# Patient Record
Sex: Male | Born: 1990 | Race: White | Hispanic: No | Marital: Single | Smoking: Never smoker
Health system: Southern US, Community
[De-identification: ages and names within clinical notes are randomized; demographics above are authoritative.]

## PROBLEM LIST (undated history)

## (undated) HISTORY — PX: WISDOM TOOTH EXTRACTION: SHX21

---
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
## 2012-10-09 ENCOUNTER — Encounter: Payer: Self-pay | Admitting: *Deleted

## 2012-10-09 ENCOUNTER — Emergency Department
Admission: EM | Admit: 2012-10-09 | Discharge: 2012-10-09 | Disposition: A | Discharge status: Home / Self Care | Payer: Managed Care, Other (non HMO) | Source: Home / Self Care | Attending: Family Medicine | Admitting: Family Medicine

## 2012-10-09 DIAGNOSIS — R21 Rash and other nonspecific skin eruption: Secondary | ICD-10-CM

## 2012-10-09 LAB — POCT CBC W AUTO DIFF (K'VILLE URGENT CARE)

## 2012-10-09 LAB — POCT MONO SCREEN (KUC): Mono, POC: NEGATIVE

## 2012-10-09 MED ORDER — HYDROXYZINE HCL 25 MG PO TABS: 25.0000 mg | ORAL_TABLET | Freq: Three times a day (TID) | ORAL | Status: DC | PRN

## 2012-10-09 NOTE — ED Provider Notes (Signed)
History     CSN: [id number]  Arrival date & time 10/09/12  1629   First MD Initiated Contact with Patient 10/09/12 1649      Chief Complaint  Patient presents with  . Rash  . Allergic Reaction    to Amox   HPI HPI  This patient complains of a RASH  Location: diffuse upper extremity   Onset: 1 day   Course: Pt went to bed with generalized itchiness. Pt woke up this am generalized rash in upper extremities. Pt is noted to have been treated with amox for strep throat 1 week ago. Has been compliant with medication. No fevers or chills. Has had some generalized fatigue as well as trouble urinating.  Self-treated with: nothing  Improvement with treatment: no.   History  Itching: yes  Tenderness: no  New medications/antibiotics: yes, amox  Pet exposure: no  Recent travel or tropical exposure: no  New soaps, shampoos, detergent, clothing: no  Tick/insect exposure: no  Chemical Exposure: no  Red Flags  Feeling ill: mild  Fever: no  Facial/tongue swelling/difficulty breathing: no  Diabetic or immunocompromised: no    History reviewed. No pertinent past medical history.  Past Surgical History  Procedure Date  . Wisdom tooth extraction     Family History  Problem Relation Age of Onset  . Cancer Mother     thyroid and breast CA  . Bipolar disorder Mother   . Depression Brother     History  Substance Use Topics  . Smoking status: Never Smoker   . Smokeless tobacco: Never Used  . Alcohol Use: No      Review of Systems  All other systems reviewed and are negative.    Allergies  Biaxin and Amoxicillin  Home Medications  No current outpatient prescriptions on file.  BP 119/76  Pulse 99  Temp 99.1 F (37.3 C) (Oral)  Resp 14  Ht 5\' 10"  (1.778 m)  Wt 163 lb (73.936 kg)  BMI 23.39 kg/m2  SpO2 98%  Physical Exam  Constitutional: He appears well-developed and well-nourished.  HENT:  Head: Normocephalic and atraumatic.  Right Ear: External ear normal.    Left Ear: External ear normal.       + oral lesions    Eyes: Conjunctivae normal are normal. Pupils are equal, round, and reactive to light.  Neck: Normal range of motion. Neck supple.  Cardiovascular: Normal rate, regular rhythm and normal heart sounds.   Pulmonary/Chest: Effort normal and breath sounds normal.  Abdominal: Soft.       No hepatosplenomegaly   Musculoskeletal: Normal range of motion.  Lymphadenopathy:    He has no cervical adenopathy.  Neurological: He is alert.  Skin: Rash noted.         ED Course  Procedures (including critical care time)  Labs Reviewed - No data to display No results found.   1. Rash       MDM  Differential diagnosis for this remains relatively broad including drug reaction secondary to amox, scarletiniform rash, secondary mono rash related to amox exposure.  Will d/c amox.  Atarax for symptomatic relief.  Will check baseline blood work including CBC, monospot, CMET, and EBV titers to better assess.  Discussed general red flags including worsening rash, decreased UOP/hematuria, trouble swallowing, trouble breathing and fever.  Plan for follow up in 1-2 days.  Discussed general care.       The patient and/or caregiver has been counseled thoroughly with regard to treatment plan and/or medications prescribed  including dosage, schedule, interactions, rationale for use, and possible side effects and they verbalize understanding. Diagnoses and expected course of recovery discussed and will return if not improved as expected or if the condition worsens. Patient and/or caregiver verbalized understanding.             Doree Albee, MD 10/09/12 1730

## 2012-10-09 NOTE — ED Notes (Signed)
Patient started Amox Rx 1 week ago for a 10 day course. His last dose was this Am. This AM he developed a rash and itching "all over". He has facial and hand edema. C/o generalized fatigue/weakness. Denies SOB, CP or dizziness. Taken one dose of Benadryl this am without relief. Dr y cough started Friday.

## 2012-10-10 ENCOUNTER — Telehealth: Payer: Self-pay | Admitting: *Deleted

## 2012-10-10 LAB — COMPREHENSIVE METABOLIC PANEL
AST: 18 U/L (ref 0–37)
Albumin: 4.8 g/dL (ref 3.5–5.2)
Alkaline Phosphatase: 47 U/L (ref 39–117)
BUN: 11 mg/dL (ref 6–23)
Glucose, Bld: 68 mg/dL — ABNORMAL LOW (ref 70–99)
Potassium: 4.2 mEq/L (ref 3.5–5.3)
Total Bilirubin: 0.7 mg/dL (ref 0.3–1.2)

## 2012-10-10 LAB — EPSTEIN-BARR VIRUS VCA, IGG: EBV VCA IgG: <10 U/mL (ref <18.0)

## 2012-10-12 ENCOUNTER — Telehealth: Payer: Self-pay | Admitting: Emergency Medicine

## 2013-06-01 ENCOUNTER — Emergency Department (INDEPENDENT_AMBULATORY_CARE_PROVIDER_SITE_OTHER)
Admission: EM | Admit: 2013-06-01 | Discharge: 2013-06-01 | Disposition: A | Discharge status: Home / Self Care | Payer: Managed Care, Other (non HMO) | Source: Home / Self Care | Attending: Family Medicine | Admitting: Family Medicine

## 2013-06-01 ENCOUNTER — Encounter: Payer: Self-pay | Admitting: Emergency Medicine

## 2013-06-01 DIAGNOSIS — R197 Diarrhea, unspecified: Secondary | ICD-10-CM

## 2013-06-01 DIAGNOSIS — H109 Unspecified conjunctivitis: Secondary | ICD-10-CM

## 2013-06-01 LAB — POCT CBC W AUTO DIFF (K'VILLE URGENT CARE)

## 2013-06-01 NOTE — ED Provider Notes (Signed)
CSN: [id number]     Arrival date & time 06/01/13  1412 History     First MD Initiated Contact with Patient 06/01/13 1424     Chief Complaint  Patient presents with  . Conjunctivitis  . Diarrhea      HPI Comments: Patient reports that about two weeks ago he developed watery diarrhea, now about 90% resolved.  Denies recent foreign travel, or drinking untreated water in a wilderness environment.   Two days ago he had a scratchy throat, and yesterday noticed slight left pinkeye.  He has been fatigued.  About 4 days ago he noted hoarseness.  Patient is a 22 y.o. male presenting with conjunctivitis. The history is provided by the patient.  Conjunctivitis This is a new problem. The current episode started yesterday. The problem occurs constantly. The problem has been gradually improving. Associated symptoms comments: Diarrhea and congestion. Nothing aggravates the symptoms. Nothing relieves the symptoms. He has tried nothing for the symptoms.    History reviewed. No pertinent past medical history. Past Surgical History  Procedure Laterality Date  . Wisdom tooth extraction     Family History  Problem Relation Age of Onset  . Cancer Mother     thyroid and breast CA  . Bipolar disorder Mother   . Depression Brother    History  Substance Use Topics  . Smoking status: Never Smoker   . Smokeless tobacco: Never Used  . Alcohol Use: No    Review of Systems  Constitutional: Positive for fatigue. Negative for fever, chills and appetite change.  HENT: Positive for congestion and sore throat. Negative for ear pain, facial swelling, neck stiffness and ear discharge.   Eyes: Positive for redness and itching. Negative for photophobia and discharge.  Respiratory: Negative.   Cardiovascular: Negative.   Gastrointestinal: Negative.   Genitourinary: Negative.     Allergies  Biaxin and Amoxicillin  Home Medications   Current Outpatient Rx  Name  Route  Sig  Dispense  Refill  . hydrOXYzine  (ATARAX/VISTARIL) 25 MG tablet   Oral   Take 1 tablet (25 mg total) by mouth 3 (three) times daily as needed for itching.   30 tablet   0    BP 117/68  Pulse 91  Temp(Src) 98.1 F (36.7 C) (Oral)  Resp 16  Ht 5\' 10"  (1.778 m)  Wt 155 lb (70.308 kg)  BMI 22.24 kg/m2  SpO2 100% Physical Exam Nursing notes and Vital Signs reviewed. Appearance:  Patient appears healthy, stated age, and in no acute distress Eyes:  Pupils are equal, round, and reactive to light and accomodation.  Extraocular movement is intact.  Conjunctivae are minimally injected, no discharge. Ears:  Canals normal.  Tympanic membranes normal.  Nose:  Mildly congested turbinates.  No sinus tenderness.   Mouth/Pharynx:  Normal; moist mucous membranes  Neck:  Supple.  No adenopathy Lungs:  Clear to auscultation.  Breath sounds are equal.  Heart:  Regular rate and rhythm without murmurs, rubs, or gallops.  Abdomen:  Nontender without masses or hepatosplenomegaly.  Bowel sounds are present.  No CVA or flank tenderness.  Extremities:  No edema.  No calf tenderness Skin:  No rash present.   ED Course   Procedures none  Labs Reviewed  POCT CBC W AUTO DIFF ([facility]) WBC 5.9; LY 49.3; MO 7.9; GR 42.8; Hgb 15.8; Platelets 205     1. Diarrhea.  Normal WBC reassuring.  Suspect viral syndrome.  ?enterovirus  2. Conjunctivitis unspecified  MDM    Avoid milk products until well.  To decrease diarrhea, mix one heaping tablespoon Citrucel (methylcellulose) in 8 oz water and drink one to three times daily.  When stools become more formed, may take Imodium (loperamide) once or twice daily to decrease stool frequency.  If symptoms become significantly worse during the night or over the weekend, proceed to the local emergency room.  For eye redness, try using refrigerated lubricating eye drops (such as "Refresh" tears) several times daily. Followup with Family Doctor if not improved in one week.   Lattie Haw,  MD 06/08/13 1758

## 2013-06-01 NOTE — ED Notes (Signed)
Reports onset of left eye conjunctivitis starting yesterday. Also wants to discuss 2 week hx of diarrhea; has not travelled outside [place].

## 2014-01-04 ENCOUNTER — Encounter: Payer: Self-pay | Admitting: Emergency Medicine

## 2014-01-04 ENCOUNTER — Emergency Department (INDEPENDENT_AMBULATORY_CARE_PROVIDER_SITE_OTHER)
Admission: EM | Admit: 2014-01-04 | Discharge: 2014-01-04 | Disposition: A | Discharge status: Home / Self Care | Payer: BC Managed Care – PPO | Source: Home / Self Care | Attending: Family Medicine | Admitting: Family Medicine

## 2014-01-04 DIAGNOSIS — J069 Acute upper respiratory infection, unspecified: Secondary | ICD-10-CM

## 2014-01-04 MED ORDER — BENZONATATE 200 MG PO CAPS: 200.0000 mg | ORAL_CAPSULE | Freq: Every day | ORAL | Status: DC

## 2014-01-04 MED ORDER — DOXYCYCLINE HYCLATE 100 MG PO CAPS
100.0000 mg | ORAL_CAPSULE | Freq: Two times a day (BID) | ORAL | Status: DC
Start: 2014-01-04 — End: 2016-12-30

## 2014-01-04 NOTE — ED Notes (Signed)
Bradley Turner complains of a dry cough for 4 days. He also reports fever, chills, body aches, headaches, runny nose, sneezing, congestion and pressure in face for 2 days. He did take NyQuil last night with some relief.

## 2014-01-04 NOTE — ED Provider Notes (Signed)
CSN: [id number]     Arrival date & time 01/04/14  1647 History   First MD Initiated Contact with Patient 01/04/14 1734     Chief Complaint  Patient presents with  . Cough    x 4 days  . Facial Pain    x 2 days      HPI Comments: Patient developed a non-productive cough 4 days ago.  Two days ago he developed mild sore throat, sinus congestion and sneezing.  Yesterday he developed increased fatigue, myalgias, fatigue, and low grade fever with chills.  The history is provided by the patient.    History reviewed. No pertinent past medical history. Past Surgical History  Procedure Laterality Date  . Wisdom tooth extraction     Family History  Problem Relation Age of Onset  . Cancer Mother     thyroid and breast CA  . Bipolar disorder Mother   . Depression Brother   . Diabetes Other    History  Substance Use Topics  . Smoking status: Never Smoker   . Smokeless tobacco: Never Used  . Alcohol Use: No    Review of Systems + sore throat + cough No pleuritic pain No wheezing + nasal congestion ? post-nasal drainage No sinus pain/pressure No itchy/red eyes No earache No hemoptysis No SOB + fever, + chills No nausea No vomiting No abdominal pain No diarrhea No urinary symptoms No skin rash + fatigue + myalgias + headache Used OTC meds without relief  Allergies  Biaxin and Amoxicillin  Home Medications   Current Outpatient Rx  Name  Route  Sig  Dispense  Refill  . Pseudoeph-Doxylamine-DM-APAP (NYQUIL PO)   Oral   Take by mouth.         . benzonatate (TESSALON) 200 MG capsule   Oral   Take 1 capsule (200 mg total) by mouth at bedtime. Take as needed for cough   12 capsule   0   . doxycycline (VIBRAMYCIN) 100 MG capsule   Oral   Take 1 capsule (100 mg total) by mouth 2 (two) times daily. (Rx void after 01/12/14)   14 capsule   0   . hydrOXYzine (ATARAX/VISTARIL) 25 MG tablet   Oral   Take 1 tablet (25 mg total) by mouth 3 (three) times daily as  needed for itching.   30 tablet   0    BP 145/81  Pulse 84  Temp(Src) 98 F (36.7 C) (Oral)  Ht 5\' 10"  (1.778 m)  Wt 164 lb (74.39 kg)  BMI 23.53 kg/m2  SpO2 99% Physical Exam Nursing notes and Vital Signs reviewed. Appearance:  Patient appears healthy, stated age, and in no acute distress Eyes:  Pupils are equal, round, and reactive to light and accomodation.  Extraocular movement is intact.  Conjunctivae are not inflamed  Ears:  Canals normal.  Tympanic membranes normal.  Nose:  Mildly congested turbinates.  No sinus tenderness.   Pharynx:  Normal Neck:  Supple.  Tender shotty posterior nodes are palpated bilaterally  Lungs:  Clear to auscultation.  Breath sounds are equal.  Heart:  Regular rate and rhythm without murmurs, rubs, or gallops.  Abdomen:  Nontender without masses or hepatosplenomegaly.  Bowel sounds are present.  No CVA or flank tenderness.  Extremities:  No edema.  No calf tenderness Skin:  No rash present.   ED Course  Procedures  none      MDM   1. Acute upper respiratory infections of unspecified site; suspect viral URI  There is no evidence of bacterial infection today.  Treat symptomatically for now  Prescription written for Benzonatate (Tessalon) to take at bedtime for night-time cough.  Take plain Mucinex (1200 mg guaifenesin) twice daily for cough and congestion.  May add Sudafed for sinus congestion.   Increase fluid intake, rest. May use Afrin nasal spray (or generic oxymetazoline) twice daily for about 5 days.  Also recommend using saline nasal spray several times daily and saline nasal irrigation (AYR is a common brand) Stop all antihistamines for now, and other non-prescription cough/cold preparations. May take Ibuprofen 200mg , 4 tabs every 8 hours with food for fever, headache, etc. Begin doxycycline if not improving about one week or if persistent fever develops (Given a prescription to hold, with an expiration date)  Follow-up with family  doctor if not improving about10 day    Lattie Haw, MD 01/06/14 1531

## 2014-01-04 NOTE — Discharge Instructions (Signed)
Take plain Mucinex (1200 mg guaifenesin) twice daily for cough and congestion.  May add Sudafed for sinus congestion.   Increase fluid intake, rest. May use Afrin nasal spray (or generic oxymetazoline) twice daily for about 5 days.  Also recommend using saline nasal spray several times daily and saline nasal irrigation (AYR is a common brand) Stop all antihistamines for now, and other non-prescription cough/cold preparations. May take Ibuprofen 200mg , 4 tabs every 8 hours with food for fever, headache, etc. Begin doxycycline if not improving about one week or if persistent fever develops   Follow-up with family doctor if not improving about10 days.

## 2016-12-30 ENCOUNTER — Emergency Department (INDEPENDENT_AMBULATORY_CARE_PROVIDER_SITE_OTHER)
Admission: EM | Admit: 2016-12-30 | Discharge: 2016-12-30 | Disposition: A | Discharge status: Home / Self Care | Payer: BLUE CROSS/BLUE SHIELD | Source: Home / Self Care | Attending: Family Medicine | Admitting: Family Medicine

## 2016-12-30 DIAGNOSIS — R369 Urethral discharge, unspecified: Secondary | ICD-10-CM

## 2016-12-30 DIAGNOSIS — Z202 Contact with and (suspected) exposure to infections with a predominantly sexual mode of transmission: Secondary | ICD-10-CM

## 2016-12-30 DIAGNOSIS — R300 Dysuria: Secondary | ICD-10-CM

## 2016-12-30 MED ORDER — DOXYCYCLINE HYCLATE 100 MG PO CAPS: 100.0000 mg | ORAL_CAPSULE | Freq: Two times a day (BID) | ORAL | 0 refills | Status: DC

## 2016-12-30 MED ORDER — CEFTRIAXONE SODIUM 250 MG IJ SOLR
250.0000 mg | Freq: Once | INTRAMUSCULAR | Status: AC
  Administered 2016-12-30: 250 mg via INTRAMUSCULAR

## 2016-12-30 NOTE — Discharge Instructions (Signed)
°  Refrain from sexual intercourse for 7 days. Be sure to have all partners tested and treated for STDs.  Practice safe sex by always wearing condoms.  ° °

## 2016-12-30 NOTE — ED Notes (Signed)
Call results to [phone]
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
## 2016-12-30 NOTE — ED Provider Notes (Signed)
CSN: [id number]     Arrival date & time 12/30/16  1017 History   None    Chief Complaint  Patient presents with  . Exposure to STD   (Consider location/radiation/quality/duration/timing/severity/associated sxs/prior Treatment) HPI  Bradley Turner is a 26 y.o. male presenting to UC with c/o penile discharge and burning with urination for about 1 week. Pt reports known exposure to someone who tested positive for gonorrhea. He has had unprotected intercourse.  Denies fever, chills, n/v/d. He reports having a rash with PCN and Biaxin.    History reviewed. No pertinent past medical history. Past Surgical History:  Procedure Laterality Date  . WISDOM TOOTH EXTRACTION     Family History  Problem Relation Age of Onset  . Cancer Mother     thyroid and breast CA  . Bipolar disorder Mother   . Depression Brother   . Diabetes Other    Social History  Substance Use Topics  . Smoking status: Never Smoker  . Smokeless tobacco: Never Used  . Alcohol use No    Review of Systems  Constitutional: Negative for chills and fever.  Gastrointestinal: Negative for abdominal pain, diarrhea, nausea and vomiting.  Genitourinary: Positive for discharge and dysuria. Negative for flank pain, frequency, genital sores, hematuria, penile pain, penile swelling and urgency.  Musculoskeletal: Negative for back pain and myalgias.    Allergies  Amoxicillin; Biaxin [clarithromycin]; and Penicillins  Home Medications   Prior to Admission medications   Medication Sig Start Date End Date Taking? Authorizing Provider  benzonatate (TESSALON) 200 MG capsule Take 1 capsule (200 mg total) by mouth at bedtime. Take as needed for cough 01/04/14   Lattie Haw, MD  doxycycline (VIBRAMYCIN) 100 MG capsule Take 1 capsule (100 mg total) by mouth 2 (two) times daily. One po bid x 7 days 12/30/16   Junius Finner, PA-C  hydrOXYzine (ATARAX/VISTARIL) 25 MG tablet Take 1 tablet (25 mg total) by mouth 3 (three) times daily as  needed for itching. 10/09/12   Floydene Flock, MD  Pseudoeph-Doxylamine-DM-APAP (NYQUIL PO) Take by mouth.    Historical Provider, MD   Meds Ordered and Administered this Visit   Medications  cefTRIAXone (ROCEPHIN) injection 250 mg (250 mg Intramuscular Given 12/30/16 1114)    BP 135/71 (BP Location: Left Arm)   Pulse 72   Temp 98.2 F (36.8 C) (Oral)   Ht 5\' 10"  (1.778 m)   Wt 174 lb (78.9 kg)   SpO2 100%   BMI 24.97 kg/m  No data found.   Physical Exam  Constitutional: He is oriented to person, place, and time. He appears well-developed and well-nourished. No distress.  HENT:  Head: Normocephalic and atraumatic.  Mouth/Throat: Oropharynx is clear and moist.  Eyes: EOM are normal.  Neck: Normal range of motion.  Cardiovascular: Normal rate and regular rhythm.   Pulmonary/Chest: Effort normal and breath sounds normal. No respiratory distress. He has no wheezes. He has no rales.  Abdominal: Soft. He exhibits no distension and no mass. There is no tenderness. There is no guarding and no CVA tenderness.  Musculoskeletal: Normal range of motion.  Neurological: He is alert and oriented to person, place, and time.  Skin: Skin is warm and dry. He is not diaphoretic.  Psychiatric: He has a normal mood and affect. His behavior is normal.  Nursing note and vitals reviewed.   Urgent Care Course     Procedures (including critical care time)  Labs Review Labs Reviewed  GC/CHLAMYDIA PROBE AMP  Imaging Review No results found.   MDM   1. STD exposure   2. Penile discharge   3. Dysuria    Pt c/o dysuria and penile discharge after known exposure to gonorrhea.   Tx in UC: Rocephin 250mg  IM  Rx: doxycycline 100mg  BID for 7 days (pt has rash with clarithromycin & is unsure if he has had azithromycin in the past)  F/u with PCP in 1 week as needed.    Junius Finnerrin O'Malley, PA-C 12/30/16 1131

## 2016-12-31 ENCOUNTER — Telehealth: Payer: Self-pay | Admitting: Emergency Medicine

## 2016-12-31 LAB — GC/CHLAMYDIA PROBE AMP
CT Probe RNA: DETECTED — AB
GC Probe RNA: DETECTED — AB

## 2017-01-02 NOTE — Telephone Encounter (Signed)
LMOM to return call to office.

## 2017-01-03 NOTE — Telephone Encounter (Signed)
Patient returned call to office. Password received, results given and discussed. Encouraged to refrain from sexual activity for at least 1 week after finishing antibiotics. Notify all partners. Sanchez verbalized understanding.

## 2017-12-03 DIAGNOSIS — R05 Cough: Secondary | ICD-10-CM | POA: Diagnosis not present

## 2017-12-03 DIAGNOSIS — J069 Acute upper respiratory infection, unspecified: Secondary | ICD-10-CM | POA: Diagnosis not present

## 2017-12-12 ENCOUNTER — Other Ambulatory Visit: Payer: Self-pay | Admitting: Family Medicine

## 2017-12-12 ENCOUNTER — Ambulatory Visit
Admission: RE | Admit: 2017-12-12 | Discharge: 2017-12-12 | Disposition: A | Discharge status: Home / Self Care | Payer: BLUE CROSS/BLUE SHIELD | Source: Ambulatory Visit | Attending: Family Medicine | Admitting: Family Medicine

## 2017-12-12 DIAGNOSIS — S299XXD Unspecified injury of thorax, subsequent encounter: Secondary | ICD-10-CM

## 2017-12-12 DIAGNOSIS — S20351D Superficial foreign body of right front wall of thorax, subsequent encounter: Secondary | ICD-10-CM | POA: Diagnosis not present

## 2017-12-12 DIAGNOSIS — R079 Chest pain, unspecified: Secondary | ICD-10-CM | POA: Diagnosis not present

## 2017-12-12 DIAGNOSIS — Z8659 Personal history of other mental and behavioral disorders: Secondary | ICD-10-CM | POA: Diagnosis not present

## 2019-06-10 ENCOUNTER — Other Ambulatory Visit: Payer: Self-pay | Admitting: *Deleted

## 2019-06-10 ENCOUNTER — Telehealth: Payer: Self-pay | Admitting: *Deleted

## 2019-06-10 DIAGNOSIS — R002 Palpitations: Secondary | ICD-10-CM

## 2019-06-10 NOTE — Telephone Encounter (Signed)
3 day ZIO XT long term holter monitor to be mailed to patients home.  Instructions reviewed briefly as the are included in the monitor kit.

## 2019-06-13 ENCOUNTER — Ambulatory Visit (INDEPENDENT_AMBULATORY_CARE_PROVIDER_SITE_OTHER): Payer: 59

## 2019-06-13 DIAGNOSIS — R002 Palpitations: Secondary | ICD-10-CM
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
## 2020-11-05 ENCOUNTER — Emergency Department
Admission: EM | Admit: 2020-11-05 | Discharge: 2020-11-05 | Disposition: A | Discharge status: Home / Self Care | Payer: BLUE CROSS/BLUE SHIELD | Source: Home / Self Care

## 2020-11-05 ENCOUNTER — Other Ambulatory Visit: Payer: Self-pay

## 2020-11-05 DIAGNOSIS — R369 Urethral discharge, unspecified: Secondary | ICD-10-CM

## 2020-11-05 MED ORDER — CEFTRIAXONE SODIUM 500 MG IJ SOLR
500.0000 mg | Freq: Once | INTRAMUSCULAR | Status: AC
  Administered 2020-11-05: 500 mg via INTRAMUSCULAR

## 2020-11-05 MED ORDER — DOXYCYCLINE HYCLATE 100 MG PO CAPS: 100.0000 mg | ORAL_CAPSULE | Freq: Two times a day (BID) | ORAL | 0 refills | Status: DC
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
## 2020-11-05 NOTE — ED Notes (Signed)
Pt waited over 15 minutes after Rocephin injection for observation. He states he feels fine, no s/s of distress.

## 2020-11-05 NOTE — ED Provider Notes (Signed)
[facility]    CSN: [id number] Arrival date & time: 11/05/20  1344      History   Chief Complaint Chief Complaint  Patient presents with  . Penile Discharge    HPI Bradley Turner is a 29 y.o. male.   HPI Bradley Turner is a 29 y.o. male presenting to UC with c/o penile discharge that started 3 days ago, associated itching and "a little" burning with urination.  Hx of unprotected sex about 1 week ago with a new partner.  Denies fever, chills, n/v/d.  Has had gonorrhea and chlamydia in the past, has done well with rocephin IM and doxycycline PO.    History reviewed. No pertinent past medical history.  There are no problems to display for this patient.   Past Surgical History:  Procedure Laterality Date  . WISDOM TOOTH EXTRACTION         Home Medications    Prior to Admission medications   Medication Sig Start Date End Date Taking? Authorizing Provider  benzonatate (TESSALON) 200 MG capsule Take 1 capsule (200 mg total) by mouth at bedtime. Take as needed for cough 01/04/14   Lattie Haw, MD  doxycycline (VIBRAMYCIN) 100 MG capsule Take 1 capsule (100 mg total) by mouth 2 (two) times daily. One po bid x 7 days 11/05/20   Lurene Shadow, PA-C  hydrOXYzine (ATARAX/VISTARIL) 25 MG tablet Take 1 tablet (25 mg total) by mouth 3 (three) times daily as needed for itching. 10/09/12   Floydene Flock, MD  Pseudoeph-Doxylamine-DM-APAP (NYQUIL PO) Take by mouth.    [provider]    Family History Family History  Problem Relation Age of Onset  . Cancer Mother        thyroid and breast CA  . Bipolar disorder Mother   . Depression Brother   . Diabetes Other   . Healthy Father     Social History Social History   Tobacco Use  . Smoking status: Never Smoker  . Smokeless tobacco: Never Used  Vaping Use  . Vaping Use: Never used  Substance Use Topics  . Alcohol use: No  . Drug use: No     Allergies   Amoxicillin, Biaxin [clarithromycin], and  Penicillins   Review of Systems Review of Systems  Constitutional: Negative for chills and fever.  Gastrointestinal: Negative for abdominal pain.  Genitourinary: Positive for dysuria and penile discharge. Negative for frequency, genital sores, hematuria, scrotal swelling, testicular pain and urgency.  Musculoskeletal: Negative for back pain.  Skin: Negative for rash.     Physical Exam Triage Vital Signs ED Triage Vitals  Enc Vitals Group     BP 11/05/20 1526 (!) 149/84     Pulse Rate 11/05/20 1526 66     Resp 11/05/20 1526 16     Temp 11/05/20 1526 98.5 F (36.9 C)     Temp Source 11/05/20 1526 Oral     SpO2 11/05/20 1526 99 %     Weight 11/05/20 1523 170 lb (77.1 kg)     Height 11/05/20 1523 5\' 10"  (1.778 m)     Head Circumference --      Peak Flow --      Pain Score 11/05/20 1523 0     Pain Loc --      Pain Edu? --      Excl. in GC? --    No data found.  Updated Vital Signs BP (!) 149/84 (BP Location: Right Arm)   Pulse 66  Temp 98.5 F (36.9 C) (Oral)   Resp 16   Ht 5\' 10"  (1.778 m)   Wt 170 lb (77.1 kg)   SpO2 99%   BMI 24.39 kg/m   Visual Acuity Right Eye Distance:   Left Eye Distance:   Bilateral Distance:    Right Eye Near:   Left Eye Near:    Bilateral Near:     Physical Exam Vitals and nursing note reviewed.  Constitutional:      Appearance: Normal appearance. He is well-developed and well-nourished.  HENT:     Head: Normocephalic and atraumatic.  Eyes:     Extraocular Movements: EOM normal.  Cardiovascular:     Rate and Rhythm: Normal rate and regular rhythm.  Pulmonary:     Effort: Pulmonary effort is normal. No respiratory distress.     Breath sounds: Normal breath sounds.  Abdominal:     General: There is no distension.     Palpations: Abdomen is soft.     Tenderness: There is no abdominal tenderness. There is no right CVA tenderness or left CVA tenderness.  Genitourinary:    Comments: Deferred, denies rash or  lesions Musculoskeletal:        General: Normal range of motion.     Cervical back: Normal range of motion.  Skin:    General: Skin is warm and dry.  Neurological:     Mental Status: He is alert and oriented to person, place, and time.  Psychiatric:        Mood and Affect: Mood and affect normal.        Behavior: Behavior normal.      UC Treatments / Results  Labs (all labs ordered are listed, but only abnormal results are displayed) Labs Reviewed - No data to display  EKG   Radiology No results found.  Procedures Procedures (including critical care time)  Medications Ordered in UC Medications  cefTRIAXone (ROCEPHIN) injection 500 mg (has no administration in time range)    Initial Impression / Assessment and Plan / UC Course  I have reviewed the triage vital signs and the nursing notes.  Pertinent labs & imaging results that were available during my care of the patient were reviewed by me and considered in my medical decision making (see chart for details).    Urine sent to lab for GC/chlamydia testing Pt declined testing for HIV or syphilis  Pt agreeable to be treated empirically Rocephin 500mg  IM given in UC Rx: doxycycline F/u with PCP as needed  Final Clinical Impressions(s) / UC Diagnoses   Final diagnoses:  Penile discharge     Discharge Instructions      Refrain from sexual intercourse for 7 days. Be sure to have all partners tested and treated for STDs.  Practice safe sex by always wearing condoms.      ED Prescriptions    Medication Sig Dispense Auth. Provider   doxycycline (VIBRAMYCIN) 100 MG capsule Take 1 capsule (100 mg total) by mouth 2 (two) times daily. One po bid x 7 days 14 capsule ,     PDMP not reviewed this encounter.   Lurene Shadow, [place] 11/05/20 1601

## 2020-11-05 NOTE — Discharge Instructions (Signed)
°  Refrain from sexual intercourse for 7 days. Be sure to have all partners tested and treated for STDs.  Practice safe sex by always wearing condoms.  ° °

## 2020-11-06 LAB — C. TRACHOMATIS/N. GONORRHOEAE RNA
C. trachomatis RNA, TMA: NOT DETECTED
N. gonorrhoeae RNA, TMA: DETECTED — AB

## 2021-04-15 DIAGNOSIS — R41840 Attention and concentration deficit: Secondary | ICD-10-CM | POA: Diagnosis not present

## 2021-04-15 DIAGNOSIS — Z7689 Persons encountering health services in other specified circumstances: Secondary | ICD-10-CM | POA: Diagnosis not present

## 2021-04-15 DIAGNOSIS — Z8659 Personal history of other mental and behavioral disorders: Secondary | ICD-10-CM | POA: Diagnosis not present

## 2021-04-22 DIAGNOSIS — F900 Attention-deficit hyperactivity disorder, predominantly inattentive type: Secondary | ICD-10-CM | POA: Insufficient documentation

## 2022-09-19 ENCOUNTER — Encounter: Payer: Self-pay | Admitting: Family Medicine

## 2022-09-19 ENCOUNTER — Ambulatory Visit: Payer: BC Managed Care – PPO | Attending: Family Medicine

## 2022-09-19 ENCOUNTER — Ambulatory Visit (INDEPENDENT_AMBULATORY_CARE_PROVIDER_SITE_OTHER): Payer: BC Managed Care – PPO | Admitting: Family Medicine

## 2022-09-19 VITALS — BP 139/81 | HR 77 | Ht 70.0 in | Wt 191.0 lb

## 2022-09-19 DIAGNOSIS — R41840 Attention and concentration deficit: Secondary | ICD-10-CM

## 2022-09-19 DIAGNOSIS — L700 Acne vulgaris: Secondary | ICD-10-CM | POA: Diagnosis not present

## 2022-09-19 DIAGNOSIS — L739 Follicular disorder, unspecified: Secondary | ICD-10-CM | POA: Insufficient documentation

## 2022-09-19 DIAGNOSIS — R002 Palpitations: Secondary | ICD-10-CM

## 2022-09-19 MED ORDER — DOXYCYCLINE HYCLATE 100 MG PO TABS: 100.0000 mg | ORAL_TABLET | Freq: Two times a day (BID) | ORAL | 0 refills | Status: AC

## 2022-09-19 MED ORDER — CLINDAMYCIN PHOS-BENZOYL PEROX 1-5 % EX GEL: Freq: Two times a day (BID) | CUTANEOUS | 0 refills | Status: DC

## 2022-09-19 NOTE — Assessment & Plan Note (Signed)
-   have sent referral to psychiatry for formal ADHD testing. Discussed that we don't want to mask adhd diagnosis with anxiety and vice versus - once he is formally tested we can create a better game plan to help [place] decide if this is adhd or anxiety

## 2022-09-19 NOTE — Assessment & Plan Note (Signed)
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 He is using proper razor techniques of clean razors and shaving cream however he is still having pustules and erythematous papules - will go ahead and treat with doxycycline and then provided pt with benzoyl peroxide-clindamycin to help clear up acne around neck.  - also advised pt to look into foil shavers and rotary razors for cleaner shaves.

## 2022-09-19 NOTE — Progress Notes (Signed)
New Patient Office Visit  Subjective    Patient ID: Bradley Turner, male    DOB: 06-12-1991  Age: 31 y.o. MRN: [id number]  CC:  Chief Complaint  Patient presents with   Establish Care    HPI Bradley Turner presents to establish care.  Pt has concerns of palpitations, ADHD concerns, and breakout on the back of his head.   He was diagnosed with ADD at 7/31yo then stopped treatment at 31yo. He has a hx of anxiety associated with panic attacks.   He is also having some heart palpitations that he is unsure if they are related to anxiety.   Mom had CAD with palpitations starting in 20s.   Outpatient Encounter Medications as of 09/19/2022  Medication Sig   clindamycin-benzoyl peroxide (BENZACLIN) gel Apply topically 2 (two) times daily.   doxycycline (VIBRA-TABS) 100 MG tablet Take 1 tablet (100 mg total) by mouth 2 (two) times daily for 7 days.   [DISCONTINUED] benzonatate (TESSALON) 200 MG capsule Take 1 capsule (200 mg total) by mouth at bedtime. Take as needed for cough   [DISCONTINUED] doxycycline (VIBRAMYCIN) 100 MG capsule Take 1 capsule (100 mg total) by mouth 2 (two) times daily. One po bid x 7 days   [DISCONTINUED] hydrOXYzine (ATARAX/VISTARIL) 25 MG tablet Take 1 tablet (25 mg total) by mouth 3 (three) times daily as needed for itching.   [DISCONTINUED] Pseudoeph-Doxylamine-DM-APAP (NYQUIL PO) Take by mouth.   No facility-administered encounter medications on file as of 09/19/2022.    No past medical history on file.  Past Surgical History:  Procedure Laterality Date   WISDOM TOOTH EXTRACTION      Family History  Problem Relation Age of Onset   Cancer Mother        thyroid and breast CA   Bipolar disorder Mother    Depression Brother    Diabetes Other    Healthy Father     Social History   Socioeconomic History   Marital status: Single    Spouse name: Not on file   Number of children: Not on file   Years of education: Not on file   Highest education level:  Not on file  Occupational History   Not on file  Tobacco Use   Smoking status: Never   Smokeless tobacco: Never  Vaping Use   Vaping Use: Never used  Substance and Sexual Activity   Alcohol use: No   Drug use: No   Sexual activity: Not on file  Other Topics Concern   Not on file  Social History Narrative   Not on file   Social Determinants of Health   Financial Resource Strain: Not on file  Food Insecurity: Not on file  Transportation Needs: Not on file  Physical Activity: Not on file  Stress: Not on file  Social Connections: Not on file  Intimate Partner Violence: Not on file    Review of Systems  Constitutional:  Negative for chills and fever.  Respiratory:  Negative for cough and shortness of breath.   Cardiovascular:  Negative for chest pain.  Neurological:  Negative for headaches.        Objective    BP 139/81   Pulse 77   Ht 5\' 10"  (1.778 m)   Wt 191 lb (86.6 kg)   SpO2 100%   BMI 27.41 kg/m   Physical Exam Vitals and nursing note reviewed.  Constitutional:      General: He is not in acute distress.    Appearance: Normal  appearance.  HENT:     Head: Normocephalic and atraumatic.     Right Ear: External ear normal.     Left Ear: External ear normal.     Nose: Nose normal.  Eyes:     Conjunctiva/sclera: Conjunctivae normal.  Cardiovascular:     Rate and Rhythm: Normal rate and regular rhythm.  Pulmonary:     Effort: Pulmonary effort is normal.     Breath sounds: Normal breath sounds.  Skin:    Comments: Back of head has scattered papules and pustules with fluctuance and yellow discharge. Erythema of lesions.  Neurological:     General: No focal deficit present.     Mental Status: He is alert and oriented to person, place, and time.  Psychiatric:        Mood and Affect: Mood normal.        Behavior: Behavior normal.        Thought Content: Thought content normal.        Judgment: Judgment normal.        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 He is using proper razor techniques of clean razors and shaving cream however he is still having pustules and erythematous papules - will go ahead and treat with doxycycline and then provided pt with benzoyl peroxide-clindamycin to help clear up acne around neck.  - also advised pt to look into foil shavers and rotary razors for cleaner shaves.      Relevant Medications   doxycycline (VIBRA-TABS) 100 MG tablet   Acne vulgaris   Relevant Medications   doxycycline (VIBRA-TABS) 100 MG tablet   clindamycin-benzoyl peroxide (BENZACLIN) gel     Other   Palpitations    - Based on patient history I believe this is more related to anxiety since he tells me that the palpitations come and go however, I do not want to rule it out as noncardiac mother does have a strong history of CAD and he tells me she had palpitations in her early 17s.  I feel at this time we should get a Zio patch monitor for 7 days to properly evaluate palpitations and see if there is any correlation with any arrhythmias      Relevant Orders   LONG TERM MONITOR (3-14 DAYS)   Concentration deficit    - have sent referral to psychiatry for formal ADHD testing. Discussed that we don't want to mask adhd diagnosis with anxiety and vice versus - once he is formally tested we can create a better game plan to help [place] decide if this is adhd or anxiety       Relevant Orders   Ambulatory referral to Psychology    Return if symptoms worsen or fail to improve.   Charlton Amor, DO

## 2022-09-19 NOTE — Progress Notes (Unsigned)
Enrolled for [employer] to mail a ZIO XT long term holter monitor to the patients address on file.   DOD to read. 

## 2022-09-19 NOTE — Patient Instructions (Signed)
Look up foil shavers and rotary shavers

## 2022-09-19 NOTE — Assessment & Plan Note (Signed)
-   Based on patient history I believe this is more related to anxiety since he tells me that the palpitations come and go however, I do not want to rule it out as noncardiac mother does have a strong history of CAD and he tells me she had palpitations in her early 48s.  I feel at this time we should get a Zio patch monitor for 7 days to properly evaluate palpitations and see if there is any correlation with any arrhythmias

## 2022-09-26 ENCOUNTER — Encounter: Payer: Self-pay | Admitting: Family Medicine

## 2022-09-26 DIAGNOSIS — R002 Palpitations: Secondary | ICD-10-CM

## 2022-11-02 DIAGNOSIS — R002 Palpitations: Secondary | ICD-10-CM | POA: Diagnosis not present

## 2023-01-12 ENCOUNTER — Ambulatory Visit: Payer: BC Managed Care – PPO | Admitting: Psychology

## 2023-01-12 DIAGNOSIS — F89 Unspecified disorder of psychological development: Secondary | ICD-10-CM | POA: Diagnosis not present

## 2023-01-12 NOTE — Progress Notes (Addendum)
Date: 01/12/2023 Appointment Start Time: 5:03pm Duration: 110 minutes Provider: Clarice Pole, PsyD Type of Session: Initial Appointment for Evaluation  Location of Patient: Home Location of Provider: Provider's Home (private office) Type of Contact: WebEx video visit with audio  Session Content:  Prior to proceeding with today's appointment, two pieces of identifying information were obtained from Bradley Turner to verify identity. In addition, Bradley Turner's physical location at the time of this appointment was obtained. In the event of technical difficulties, Bradley Turner shared a phone number he could be reached at. [name] and this provider participated in today's telepsychological service. [name] denied anyone else being present in the room or on the virtual appointment.  The provider's role was explained to Bradley Turner. The provider reviewed and discussed issues of confidentiality, privacy, and limits therein (e.g., reporting obligations). In addition to verbal informed consent, written informed consent for psychological services was obtained from Bradley Turner prior to the initial appointment. Written consent included information concerning the practice, financial arrangements, and confidentiality and patients' rights. Since the clinic is not a 24/7 crisis center, mental health emergency resources were shared, and the provider explained e-mail, voicemail, and/or other messaging systems should be utilized only for non-emergency reasons. This provider also explained that information obtained during appointments will be placed in their electronic medical record in a confidential manner. Addis verbally acknowledged understanding of the aforementioned and agreed to use mental health emergency resources discussed if needed. Moreover, Bradley Turner agreed information may be shared with other [facility] or their referring provider(s) as needed for coordination of care. By signing the new patient documents, Bradley Turner provided written consent for coordination of care.  Bradley Turner verbally acknowledged understanding he is ultimately responsible for understanding his insurance benefits as it relates to reimbursement of telepsychological and in-person services. This provider also reviewed confidentiality, as it relates to telepsychological services, as well as the rationale for telepsychological services. This provider further explained that video should not be captured, photos should not be taken, nor should testing stimuli be copied or recorded as it would be a copyright violation. Sargent expressed understanding of the aforementioned, and verbally consented to proceed.  [name] completed the psychiatric diagnostic evaluation (history, including past, family, social, and  psychiatric history; behavioral observations; and establishment of a provisional diagnosis). The evaluation was completed in 110 minutes. Code [id number] was billed.   Mental Status Examination:  Appearance:  neat Behavior: appropriate to circumstances Mood: neutral Affect: mood congruent Speech: pressured Eye Contact: appropriate Psychomotor Activity: restless Thought Process: linear, logical, and goal directed and denies suicidal, homicidal, and self-harm ideation, plan and intent Content/Perceptual Disturbances: none Orientation: AAOx4 Cognition/Sensorium: intact Insight: good Judgment: good  Provisional DSM-5 diagnosis(es):  F89 Unspecified Disorder of Psychological Development   Plan: Testing is expected to answer the question, does the individual meet criteria for ADHD when age, other mental health concerns (e.g., anxiety and depression), and cognitive functioning are taken into consideration? Further testing is warranted because a diagnosis cannot be given solely based on current interview data (further data is required). Testing results are expected to answer the remaining diagnostic questions in order to provide an accurate diagnosis and assist in treatment planning with an expectation of improved  clinical outcome. Bradley Turner is currently scheduled for an appointment on 01/18/2023 at 4pm via WebEx video visit with audio.       CONFIDENTIAL PSYCHOLOGICAL EVALUATION ______________________________________________________________________________  Name: Bradley Turner   Date of Birth: 09-08-1991    Age: 32  SOURCE AND REASON FOR REFERRAL: Bradley Turner was referred by  Bradley Turner for an evaluation to ascertain if he meets criteria for Attention Deficit/Hyperactivity Disorder (ADHD).    BACKGROUND INFORMATION AND PRESENTING PROBLEM: Bradley Turner is a 32 year old male who resides in [place]  Bradley Turner shared he believes he was diagnosed with "ADD" by his pediatrician at age 66 or 74, adding he was prescribed an unknown ADHD-related medication and utilized it until his father ceased its use because he believed he "just needed discipline." He further shared how after ceasing use of the medicine there was a "dip" in his academic performance. He described his ADHD-related concerns as including being easily distracted by various stimuli (e.g., sounds, his phone, or use of the internet); task initiation (e.g., often procrastinating tasks that he finds "daunting," has aspects he does not fully know how to do, and/or if he is "fatigued" which las led to others making comments about it and contributes to stress), maintenance (e.g., prone to "jump[ing] around" to different tasks before completing them), and disengagement (e.g., commonly having urges to complete certain tasks despite the negative repercussions it can cause on completing higher priority tasks, which he attributed to trouble prioritizing tasks, difficulty remembering to return to the task later, or concerns he will not have the "motivation" to finish the task at a later time); regular forgetfulness of a variety of things (e.g., tasks he needs to do or has already done, misplacing items, and what others have said); visual (e.g.,  frequently not seeing items in his view that he is looking for) and verbal (e.g., trouble remembering points someone has mentioned as they continue talking) working memory-related problems; often making careless mistakes and missing small details (e.g., "not seeing" a part of the instructions he read or a mistake he had made until someone else points it out); self-expression issues (e.g., having a "mental block on words" he wants to say); experiencing irritability, annoyance, or overstimulation if someone points out a mistake he made or makes him feel "inadequate," he perceives issues with others driving abilities, multiple people are talking to him at one time, or someone interrupts him while he is focused on a task, sharing how this can lead to self-critical thoughts and sadness; impulsivity that includes often agreeing to plans that negatively impacted his ability to complete school assignments, "speak[ing] before I think," and unneeded purchases that cause difficulty saving; difficulty sustaining his attention when others are speaking to him despite efforts and a desire to do so; disorganization (e.g., his environment being commonly cluttered which contributes to misplacing items) that he attributed to "low motivation" to maintain organization systems; feel restless when required to stay seated to the point it is "almost physically painful;" habitual fidgeting (e.g., adjusting his sitting position and "shaking" his leg); frequently having urges to interrupt that he attributed to wanting to mention something before the conversation topic changes, "to point something out," and concerns he will forget what he wants to say if he does not say it then; regularly driving [id number] over the speed limit due to a desire to "get to the destination faster" and a dislike of driving; and occasional trouble following through on promises or commitments to others despite placing importance on doing so and feeling "like a jackass"  if he does not follow through. He also described a history of anxiety; periods of feeling "down" that usually last "a day or two," although he shared on one instance he "felt dead inside" for two-to-three months as a result of psychosocial stressors and a "panic  attack;" generalized anxiety and tension that can be seemingly random or "situational" (e.g., withdrawing from college and moving back in with his parents); sleep maintenance issues (e.g., waking up and having trouble falling back asleep), snoring that started in his "late teens" and appears related to periods of weight gain, and a "few" instances of sleep paralysis; overeating-related behaviors (e.g., "impulsive[ly]" "eating "past the point of full" if he enjoys the food, which can cause physical discomfort); and trouble with self-expression that he noted is in part due to "gr[owing] up gay in a conservative household." He described his ADHD-related concerns as "every day" and independent of mood, although environments with multiple stimuli, anxiety, and reduced sleep exacerbate them.   Mr. Ritchie denied awareness of ever experiencing any developmental milestone delays, grade retention, learning disability diagnosis, of having an IEP. He shared he attended a "remedial reading group" as it "took him a bit longer to start reading," which he attributed to doubts about his reading abilities and that once he realized "I can do this" he quickly became an "advanced reader." He also shared mathematics has "never [been] clear cut" and difficult for him as he frequently felt he knew the proper calculations but it was "hard to get to the right answer." He discussed while utilizing an unknown ADHD-related medication his grades were "straight As," but after ceasing they became a "C average" although he still obtained "As" and "Bs" in classes he enjoyed (e.g., history and English). He also discussed how he was "good at taking tests" but had trouble completing homework  which teachers commented on it as well as was being prone to "day dreaming" and "thinking about more interesting things" while "pretending to pay attention" during class. He stated he attended college "on and off" for several years but only completed 2-3 years' worth of classes as he withdrew from approximately half of the classes due to difficulty completing assignments and becoming "overwhelmed by what needed to be done." He reported he is currently employed at [employer], and denied a history of disciplinary action at any employment positions. 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   Mr. Arnesen described his medical history as unremarkable as well as denying having ever experiencing head injury or seizures. He reported use of 16oz of coffee daily and one-to-two standard size drinks of alcohol a week. He denied having been psychiatrically hospitalized or having utilized mental health services. He also denied ever experiencing hypomanic or manic episode; obsessions and compulsions; psychosis; trauma- and stressor-related disorder; suicidal or homicidal ideation, plan, or intent; and legal involvement. He stated his familial mental health history is significant for "depression," "anxiety," "OCD," "bipolar disorder," and possible schizophrenia. He also shared a belief his mother and father have possible ADHD as his father was better with "hands on tasks" and "rambunctious," and they both struggled "keeping interest in" and "prioritz[ing] schooling."  Chart Review: On a 09/19/2022 appointment note, Dr. Mel Almond reported he presented with "palpitations" and "ADHD concerns," and that he stated he was "diagnosed with ADD at [age]yo then stopped treatment at 32yo" and has a history of "anxiety associated with panic attacks." She further reported his mother had CAD with palpitations which led to her recommending he use  a Zio patch monitor to evaluate his endorsed palpitations. She also referred him for an ADHD evaluation to assist in "create[ing] a better game plan to help [them] decide if [his endorsed concerns  are] ADHD or anxiety."            Dolores Lory, PsyD

## 2023-01-18 ENCOUNTER — Ambulatory Visit: Payer: BC Managed Care – PPO | Admitting: Psychology

## 2023-01-18 DIAGNOSIS — F89 Unspecified disorder of psychological development: Secondary | ICD-10-CM

## 2023-01-18 NOTE — Progress Notes (Signed)
Date: 01/18/2023   Appointment Start Time: 4:03pm Duration: 65 minutes Provider: Clarice Pole, PsyD Type of Session: Testing Appointment for Evaluation  Location of Patient: Home Location of Provider: Provider's Home (private office) Type of Contact: WebEx video visit with audio  Session Content: Today's appointment was a telepsychological visit due to COVID-19. Bradley Turner is aware it is his responsibility to secure confidentiality on his end of the session. Prior to proceeding with today's appointment, Bradley Turner's physical location at the time of this appointment was obtained as well a phone number he could be reached at in the event of technical difficulties. Zeal denied anyone else being present in the room or on the virtual appointment. This provider reviewed that video should not be captured, photos should not be taken, nor should testing stimuli be copied or recorded as it would be a copyright violation. Clary expressed understanding of the aforementioned, and verbally consented to proceed. The WAIS-IV was administered, scored, and interpreted by this evaluator  Billing codes will be input on the feedback appointment. There are no billing codes for the testing appointment.   Provisional DSM-5 diagnosis(es):  F89 Unspecified Disorder of Psychological Development   Plan: [name] was scheduled for a feedback appointment on 01/25/2023 at 3pm via WebEx video visit with audio.                Dolores Lory, PsyD

## 2023-01-25 ENCOUNTER — Ambulatory Visit: Payer: BC Managed Care – PPO | Admitting: Psychology

## 2023-01-25 DIAGNOSIS — F902 Attention-deficit hyperactivity disorder, combined type: Secondary | ICD-10-CM

## 2023-01-25 NOTE — Progress Notes (Signed)
Testing and Report Writing Information: The following measures  were administered, scored, and interpreted by this provider:  Generalized Anxiety Disorder-7 (GAD-7; 5 minutes), Patient Health Questionnaire-9 (PHQ-9; 5 minutes), Wechsler Adult Intelligence Scale-Fourth Edition (WAIS-IV; 70 minutes), CNS Vital Signs (45 minutes), Adult Attention Deficit/Hyperactivity Disorder Self-Report Scale Checklist (ASRSv1.1; 15 minutes), Behavior Rating Inventory for Executive Function - A - Self Report (BRIEF A; 10 minutes) and Behavior Rating Inventory for Executive Function - A - Informant  (BRIEF-A; 10 minutes) , Personality Assessment Inventory (PAI; 50 minutes). A total of 210 minutes was spent on the administration and scoring of the aforementioned measures. Codes [id number] and [phone] (6 units) were billed.  Please see the assessment for additional details. This provider completed the written report which includes integration of patient data, interpretation of standardized test results, interpretation of clinical data, review of information provided by Bradley Turner and any collateral information/documentation, and clinical decision making (275 minutes in total).  Feedback Appointment: Date: 01/25/2023 Appointment Start Time: 3pm Duration: 40 minutes Provider: Clarice Pole, PsyD Type of Session: Feedback Appointment for Evaluation  Location of Patient: Home Location of Provider: Provider's Home (private office) Type of Contact: WebEx video visit with audio  Session Content: Today's appointment was a telepsychological visit due to COVID-19. Bradley Turner is aware it is his responsibility to secure confidentiality on his end of the session. He provided verbal consent to proceed with today's appointment. Prior to proceeding with today's appointment, Bradley Turner's physical location at the time of this appointment was obtained as well a phone number he could be reached at in the event of technical difficulties. Bradley Turner denied anyone else being  present in the room or on the virtual appointment.  This provider and [name] completed the interactive feedback session which includes reviewing the aforementioned measures, treatment recommendations, and diagnostic conclusions.   The interactive feedback session was completed today and a total of 40 minutes was spent on feedback. Code [phone] was billed for feedback session.   DSM-5 Diagnosis(es):  F90.2 Attention-Deficit/Hyperactivity Disorder, Combined Presentation, Moderate  Time Requirements: Assessment scoring and interpreting: 210 minutes (billing code [phone] and [phone] [6 units]) Feedback: 40 minutes (billing code [phone]) Report writing: 275 total minutes. 01/05/2023: 2:40-2:50pm (inputting chart review information into the report). 01/14/2023: 9:55-10:40am and 11:25-12:34m. 01/16/2023: 8:40-9:10pm. 01/18/2023: 5:35-5:45pm. 01/19/2023: 8-9:10pm. 01/21/2023: 10:10-10:50am.; 01/23/2023: 8:55-9:15pm. 01/24/2023: 1:25-1:30pm.  (billing code [phone] [5 units])  Plan: Bradley Turner provided verbal consent for his evaluation to be sent via e-mail. No further follow-up planned by this provider.          CONFIDENTIAL PSYCHOLOGICAL EVALUATION ______________________________________________________________________________  Name: Bradley Turner   Date of Birth: 08-05-1991    Age: 32 Dates of Evaluation: 01/12/2023, 01/14/2023, 01/16/2023, and 01/18/2023   SOURCE AND REASON FOR REFERRAL: [name] was referred by Dr. Elmo Turner for an evaluation to ascertain if he meets criteria for Attention Deficit/Hyperactivity Disorder (ADHD).   EVALUATIVE PROCEDURES: Clinical Interview with [name] (01/12/2023) Wechsler Adult Intelligence Scale-Fourth Edition (WAIS-IV; 01/18/2023) CNS Vital Signs (01/16/2023) Adult Attention Deficit/Hyperactivity Disorder Self-Report Scale Checklist (01/16/2023) Behavior Rating Inventory for Executive Function - A - Self Report Behavior Rating Inventory for Executive Function - A - Self  Report (BRIEF-A; 01/14/2023) and Informant (01/14/2023) Personality Assessment Inventory (PAI; 01/14/2023) Patient Health Questionnaire-9 (PHQ-9) Generalized Anxiety Disorder-7 (GAD-7)   BACKGROUND INFORMATION AND PRESENTING PROBLEM: [name] is a 32 year old male who resides in [place].  [name] shared he believes he was diagnosed with "ADD" by his pediatrician at age 57 or  9, adding he was prescribed an unknown ADHD-related medication and utilized it until his father ceased its use because he believed he "just needed discipline." He further shared after ceasing use of the medicine there was a "dip" in his academic performance. [name] described his ADHD-related concerns as including being easily distracted by various stimuli (e.g., sounds, his phone, or use of the internet); task initiation (e.g., often procrastinating tasks that he finds "daunting," has aspects he does not fully know how to do, and/or if he is "fatigued" which has led to others making comments about it and contributes to stress), maintenance (e.g., prone to "jump[ing] around" to different tasks before completing them), and disengagement (e.g., commonly having urges to complete certain tasks despite the negative repercussions it can cause on completing higher priority tasks, which he attributed to trouble prioritizing tasks, difficulty remembering to return to the task later, or concerns he will not have the "motivation" to finish the task at a later time); regular forgetfulness of a variety of things (e.g., tasks he needs to do or has already done, misplacing items, and what others have said); visual (e.g., frequently not seeing items in his view that he is looking for) and verbal (e.g., trouble remembering points someone has mentioned as they continue talking) working memory-related problems; often making careless mistakes and missing small details (e.g., "not seeing" a part of the instructions he read or a mistake he had  made until someone else points it out); self-expression issues (e.g., having a "mental block on words" he wants to say); experiencing irritability, annoyance, or overstimulation if someone points out a mistake he made or makes him feel "inadequate," he perceives issues with others driving abilities, multiple people are talking to him at one time, or someone interrupts him while he is focused on a task, sharing this can lead to self-critical thoughts and sadness; impulsivity that includes often agreeing to plans that negatively impacted his ability to complete school assignments, "speak[ing] before [he] think[s]," and unneeded purchases that cause difficulty saving; difficulty sustaining his attention when others are speaking to him despite efforts and a desire to do so; disorganization (e.g., his environment being commonly cluttered which contributes to misplacing items) that he attributed to "low motivation" to maintain organization systems; feeling restless when required to stay seated to the point it is "almost physically painful;" habitual fidgeting (e.g., adjusting his sitting position and "shaking" his leg); frequently having urges to interrupt that he attributed to wanting to mention something before the conversation topic changes, "to point something out," and concerns he will forget what he wants to say if he does not say it then; regularly driving [id number] over the speed limit due to a desire to "get to the destination faster" and a dislike of driving; and occasional trouble following through on promises or commitments to others despite placing importance on doing so and feeling "like a jackass" if he does not follow through. He also described a history of anxiety; periods of feeling "down" that usually last "a day or two," although he shared on one instance he "felt dead inside" for two-to-three months as a result of psychosocial stressors and a "panic attack;" generalized anxiety and tension that can be  seemingly random or "situational (e.g., withdrawing from college and moving back in with his parents);" sleep maintenance issues (e.g., waking up and having trouble falling back asleep), snoring that started in his "late teens" and appears related to periods of weight gain, and a "few" instances of sleep paralysis;  overeating-related behaviors (e.g., "impulsive[ly] eating past the point of full" if he enjoys the food, which can cause physical discomfort); and trouble with self-expression that he noted is in part due to "gr[owing] up gay in a conservative household." He described his ADHD-related concerns as occurring "every day" and independent of mood, although environments with multiple stimuli, anxiety, and reduced sleep exacerbate them.   Bradley Turner denied awareness of ever experiencing any developmental milestone delays, grade retention, learning disability diagnosis, or having an IEP. He shared he attended a "remedial reading group" as it "took [him] a bit longer to start reading," which he attributed to doubts about his reading abilities and that once he realized "[he could] do this" he quickly became an "advanced reader." He also shared mathematics has "never [been] clear cut" and difficult for him as he frequently felt he knew the proper calculations but it was "hard to get to the right answer." Bradley Turner discussed while utilizing an unknown ADHD-related medication his grades were "straight As," but after ceasing the medication, they became a "C average" although he still obtained "As" and "Bs" in classes he enjoyed (e.g., history and English). He also discussed he was "good at taking tests" but had trouble completing homework which teachers commented on it as well as was being prone to "day dreaming" and "thinking about more interesting things" while "pretending to pay attention" during class. He stated he attended college "on and off" for several years but only completed 2-3 years' worth of classes  as he withdrew from approximately half of the classes due to difficulty completing assignments and becoming "overwhelmed by what needed to be done." Mr. Catching reported he is currently employed at [employer], and denied a history of disciplinary action at any employment positions. 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   Mr. Stankevich described his medical history as unremarkable as well as denied a history of experiencing head injury or seizures. He reported use of 16oz of coffee daily and one-to-two standard size drinks of alcohol a week. He denied a history of psychiatric hospitalizations and utilization of mental health services. He also denied ever experiencing hypomanic or manic episode; obsessions and compulsions; psychosis; trauma- and stressor-related disorder; suicidal or homicidal ideation, plan, or intent; and legal involvement. Mr. Lizak stated his familial mental health history is significant for "depression," "anxiety," "OCD," "bipolar disorder," and possible schizophrenia. He also shared a belief his mother and father possibly meet criteria for ADHD as his father was better with "hands on tasks" and "rambunctious," and they both struggled "keeping interest in" and "prioritz[ing] schooling."  Chart Review: On a 09/19/2022 appointment note, Dr. Mel Almond reported Mr. Teaney presented with "palpitations" and "ADHD concerns," and he stated he was "diagnosed with ADD at [age]yo then stopped treatment at 32yo" and has a history of "anxiety associated with panic attacks." She further reported his mother had CAD with palpitations which led to her recommending he use a Zio patch monitor to evaluate his endorsed palpitations. She also referred him for an ADHD evaluation to assist in "create[ing] a better game plan to help [them] decide if [his endorsed concerns are] ADHD or anxiety."  BEHAVIORAL  OBSERVATIONS: Mr. Stutes presented on time for the evaluation. He was well-groomed. He was oriented to time, place, person, and purpose of the appointment. During the evaluation Mr. Barbian verbalized and/or demonstrated occasional self-expression difficulties (e.g., stating "I am not sure how  to describe [the similarity between two provided words]" or "[The provided word] is something I know what it is but not how to explain it") and auditory working memory-related difficulties (e.g., having this evaluator repeat verbally provided arithmetic questions, using his fingers to assist in tracking and manipulating verbally provided information, and stating "That is where I have to stop" after providing three of six verbally presented digits). Throughout the course of the evaluation, he maintained appropriate eye contact. His thought processes and content were logical, coherent, and goal-directed. There were no overt signs of a thought disorder or perceptual disturbances, nor did she report such symptomatology. There was no evidence of paraphasias (i.e., errors in speech, gross mispronunciations, and word substitutions), repetition deficits, or disturbances in volume or prosody (i.e., rhythm and intonation). Overall, based on Mr. Ashbeck approach to testing, the current results are believed to be a good estimate of his abilities.  PROCEDURAL CONSIDERATIONS:  Psychological testing measures were conducted through a virtual visit with video and audio capabilities, but otherwise in a standard manner.   The Wechsler Adult Intelligence Scale, Fourth Edition (WAIS-IV) was administered via remote telepractice using digital stimulus materials on [employer]'s Q-global system. The remote testing environment appeared free of distractions, adequate rapport was established with the examinee via video/audio capabilities, and Mr. Boyajian appeared appropriately engaged in the task throughout the session. During one Digit Span task Mr.  Drzewiecki indicated his cat was distracting him, which may have negatively impacted his performance on that task as he gave an erroneous answer. No other significant technological problems or distractions were noted during administration. Modifications to the standardization procedure included: none. The WAIS-IV subtests, or similar tasks, have received initial validation in several samples for remote telepractice and digital format administration, and the results are considered a valid description of Mr. Espina skills and abilities.  CLINICAL FINDINGS:  COGNITIVE FUNCTIONING  Wechsler Adult Intelligence Scale, Fourth Edition (WAIS-IV):  Mr. Lalor completed subtests of the WAIS-IV, a full-scale measure of cognitive ability. He completed subtests of the WAIS-IV, a full-scale measure of cognitive ability. The WAIS-IV is comprised of four indices that measures cognitive processes that are components of intellectual ability; however, only subtests from the Verbal Comprehension and Working Memory indices were administered. As a result, Full-Scale-IQ (FSIQ) and General Ability Index (GAI) were unable to be determined.   WAIS-IV Scale/Subtest IQ/Scaled Score 95% Confidence Interval Percentile Rank Qualitative Description  Verbal Comprehension (VCI) 114 108-119 82 High Average  Similarities 11     Vocabulary 11     Information 16     Working Memory (WMI) 102 95-109 55 Average  Digit Span 9     Arithmetic 12       The Verbal Comprehension Index (VCI) provides a measure of one's ability to receive, comprehend, and express language. It also measures the ability to retrieve previously learned information and to understand relationships between words and concepts presented orally. Mr. Schauer obtained a VCI scaled score of 114 (82nd percentile) placing him in the high average range compared to same-aged peers. His performance on the subtests comprising this index was diverse. Out of the three subtests, Mr.  Hallum demonstrated the strongest performance on the Information subtest which is primarily a measure of his fund of general knowledge but may also be influenced by cultural experience, quality of education, and ability to retrieve information from long-term memory. His lowest performances were on the Similarities subtest, which measured his ability to abstract meaningful concepts and relationships from verbally presented material  as well as the Vocabulary subtest, which required him to explain the meaning of words presented in isolation, verbalize meaningful concepts, and retrieve information from long-term memory. This pattern of performance may suggest relative weaknesses in language production and abstract reasoning.   The Working Memory Index ([place]) provides a measure of one's ability to sustain attention, concentrate, and exert mental control. Mr. Brouse obtained a WMI scaled score of 102 (55th percentile), placing him in the average range compared to same-aged peers. The 12-point difference between the VCI and WMI scores is statistically significant at the .05 level, which suggests his ability to sustain attention, concentrate, and exert mental control are a relative weakness to his verbal reasoning abilities. His score on the Arithmetic subtest is higher than his score on Digit Span which may indicate specific strengths in arithmetic computational skills rather than a general proficiency in working memory.  ATTENTION AND PROCESSING  CNS Vital Signs: The CNS Vital Signs assessment evaluates the neurocognitive status of an individual and covers a range of mental processes. The results of the CNS Vital Signs testing indicated average neurocognitive processing ability. Regarding attention, simple attention and complex attention were in the average range and sustained attention was a strength in the above range. Executive function and cognitive flexibility were in the average range. Working memory was a  strength in the above range. Psychomotor speed, motor speed, and processing speed were average which indicates average hand-eye coordination and thinking speed. Reaction time was a weakness in the low average range. Visual memory (images) and verbal memory (words) were average, although verbal memory was 13 points higher which indicates it is a relative strength. The results suggest Mr. Aung experiences a weakness in reaction time and strengths in working memory and sustained attention.   Domain  Standard Score Percentile Validity Indicator Guideline  Neurocognitive Index 94 34 Yes Average  Composite Memory 103 58 Yes Average  Verbal Memory 109 73 Yes Average  Visual Memory 96 40 Yes Average  Psychomotor Speed 96 40 Yes Average  Reaction Time 86 18 Yes Low Average  Complex Attention 94 34 Yes Average  Cognitive Flexibility 90 25 Yes Average  Processing Speed  96 40 Yes Average  Executive Function 96 40 Yes Average  Working Memory 118 88 Yes Above  Sustained Attention 116 86 Yes Above  Simple Attention 107 68 Yes Average  Motor Speed 98 45 Yes Average  EXECUTIVE FUNCTION  Behavior Rating Inventory of Executive Function, Second Edition (BRIEF-A) Self-Report: Mr. Kukuk completed the Self-Report Form of the Behavior Rating Inventory of Executive Function-Adult Version (BRIEF-A), which has three domains that evaluate cognitive, behavioral, and emotional regulation, and a Global Executive Composite score provides an overall snapshot of executive functioning. There are no missing item responses in the protocol. The Negativity, Infrequency, and Inconsistency scales are not elevated, suggesting he did not respond to the protocol in an overly negative, haphazard, extreme, or inconsistent manner. In the context of these validity considerations, ratings of Mr. Bassin everyday executive function suggest some areas of concern. The overall index, the Global Executive Composite (GEC), was elevated (GEC T  = 83, %ile = 99). Both the Behavioral Regulation (BRI) and the Metacognition (MI) Indexes were elevated (BRI T = 75, %ile = 97 and MI T = 85, %ile = >99). Mr. Amjad indicated difficultly with his ability to inhibit impulsive responses, adjust to changes in routine or task demands, modulate emotions, initiate problem solving or activity, sustain working memory, plan and organize problem-solving approaches, and  attend to task-oriented output. He did not describe his ability to monitor social behavior and organize the environment and materials as problematic. Mr. Grahn profile suggests significant problem-solving rigidity combined with emotional dysregulation. Individuals with this profile tend to lose emotional control when their routines or perspectives are challenged and/or flexibility is required. Moreover, the elevated scores on the Inhibit scale as well as the Behavioral Regulation and the Metacognition Indexes, suggest he is experiencing poor inhibitory control and/or suggest more global behavioral dysregulation is having a negative effect on active metacognitive problem solving.  Scale/Index  Raw Score T Score Percentile  Inhibit 18 71 97  Shift 14 74 98  Emotional Control 24 71 96  Self-Monitor 12 64 94  Behavioral Regulation Index (BRI) 68 75 97  Initiate 22 84 >99  Working Memory 22 88 >99  Plan/Organize 26 83 99  Task Monitor 16 83 >99  Organization of Materials 17 63 93  Metacognition Index (MI) 103 85 >99  Global Executive Composite (GEC) 171 83 99   Validity Scale Raw Score Cumulative Percentile Protocol Classification  Negativity 1 0 - 98.3 Acceptable  Infrequency 0 0 - 97.3 Acceptable  Inconsistency 3 0 - 99.2 Acceptable   Behavior Rating Inventory of Executive Function, Second Edition (BRIEF-A) Informant: Mr. Shad friend, Mr. Sharyl Nimrod, completed the Informant Form of the Behavior Rating Inventory of Executive Function-Adult Version (BRIEF-A), which is equivalent to  the Self-Report version and has three domains that evaluate cognitive, behavioral, and emotional regulation, and a Global Executive Composite score provides an overall snapshot of executive functioning. There are no missing item responses in the protocol; however, the Negativity scale is elevated, suggesting either the respondent's view of Mr. Halliday may be excessively negative, or Mr. Fristoe may be exhibiting significant executive dysfunction. Items were completed in a reasonable fashion, suggesting Mr. Moser did not respond to items in a haphazard or extreme manner. In the context of these validity considerations, Mr. Conrad Good Thunder ratings of Mr. Kohlhoff everyday executive function suggest some areas of concern. The overall index, the Global Executive Composite (GEC), was elevated (GEC T = 80, %ile = >99). Both the Behavioral Regulation (BRI) and the Metacognition (MI) Indexes were elevated (BRI T = 76, %ile = >99 and MI T = 80, %ile = >99). Mr. Ermalene Postin indicated Mr. Critcher experiences difficultly with his ability to inhibit impulsive responses, adjust to changes in routine or task demands, modulate emotions, initiate problem solving or activity, sustain working memory, plan and organize problem-solving approaches, and attend to task-oriented output. Mr. Ermalene Postin did not describe Mr. Bessler ability to monitor social behavior and organize the environment and materials as problematic. Individuals with this profile tend to lose emotional control when their routines or perspectives are challenged and/or flexibility is required. Moreover, the elevated scores on the Inhibit scale as well as the Behavioral Regulation and the Metacognition Indexes, suggest he is perceived as experiencing poor inhibitory control and/or suggest more global behavioral dysregulation is having a negative effect on active metacognitive problem solving. Upon follow-up, Mr. Dirusso indicated being unsure what may have contributed to Mr. Moser's  elevated Negativity scale, although stated he notified him to be "brutally honest" when filling it out.   Scale/Index  Raw Score T Score Percentile  Inhibit 22 84 >99  Shift 18 84 >99  Emotional Control 26 70 96  Self-Monitor 11 56 79  Behavioral Regulation Index (BRI) 77 76 >99  Initiate 23 83 >99  Working Memory 24 91 >99  Plan/Organize 29  83 >99  Task Monitor 16 79 99  Organization of Materials 14 52 68  Metacognition Index (MI) 106 80 >99  Global Executive Composite (GEC) 183 80 >99   Validity Scale Raw Score Cumulative Percentile Protocol Classification  Negativity 7 99.0 - 100 Elevated  Infrequency 0 0 - 93.3 Acceptable  Inconsistency 2 0 - 98.8 Acceptable    BEHAVIORAL FUNCTIONING   Patient Health Questionnaire-9 (PHQ-9): Bradley Turner completed the PHQ-9, a self-report measure that assesses symptoms of depression. He scored 18/27, which indicates moderately severe depression.   Generalized Anxiety Disorder-7 (GAD-7): Bradley Turner completed the GAD-7, a self-report measure that assesses symptoms of anxiety. He scored 12/21, which indicates moderate anxiety.   Adult ADHD Self-Report Scale Symptom Checklist (ASRS): Bradley Turner reported the following symptoms as sometimes: problems remembering appointments or obligations, struggling to concentrate on what people say even when they are speaking directly to him, misplacing or has difficulty finding things, talking too much in social situations, and interrupting others or finishing their sentences. He endorsed the following symptoms as occurring often: struggling to sustain attention when doing boring or repetitive work, difficulty wrapping up final details of a project following the completion of challenging aspects, difficulty getting things in order when a task requires organization, being distracted by noise around him, and feeling restless or fidgety. He endorsed the following symptoms as very often: avoiding or delaying getting  started on tasks requiring a lot of thought, fidgeting or squirming, and making careless mistakes when working on boring or difficult projects. Endorsement of at least four items in Part A is highly consistent with ADHD in adults. The frequency scores of Part B provides additional cues. Bradley Turner scored a 5/6 on Part A and 6/12 on Part B, which is considered a positive screening for ADHD.   Personality Assessment Inventory (PAI): The PAI is an objective inventory of adult personality. The validity indicators suggested [name] profile is interpretable (ICN T = 58, INF T = 59, NIM T = 51, and PIM T = 45). He is endorsing beliefs of inadequacy in dealing with demands of the environment (DEP-C T = 87) and identity issues (BOR-I T = 65) that may at least partially explain feelings of sadness and anhedonia (DEP-A T = 74 and BOR-A T = 60), tension and difficulty relaxing (ANX-A T = 65 and ANX-P T = 69), and having specific fears (ARD-P T = 70); problems in concentration and decision making (SCZ-T T = 70); impulsivity and recklessness (BOR-S T = 64); and seeking to relinquish control in relationships and preferring to approach them in a passive manner (DOM T = 33). He appears to acknowledge significant difficulties in functioning and having the perception that help is needed in dealing with these problems (RXR T = 31).    SUMMARY AND CLINICAL IMPRESSIONS: Bradley Turner is a 32 year old male who was referred by Dr. Elmo Turner for an evaluation to determine if he currently meets criteria for a diagnosis of Attention-Deficit/Hyperactivity Disorder (ADHD).   Mr. Mathwig reported he was diagnosed with "ADD" and prescribed an unknown ADHD-related medication by his pediatrician. He further reported his use of the medication was eventually ceased as his father felt he "just needed discipline," which caused a "dip" in his academic performance. He also described a history of periods of feeling "down" that usually  last "a day or two," although one period lasted for two-to-three months; generalized anxiety and tension that can be seemingly random or "situational;" sleep maintenance issues,  snoring, and a "few" instances of sleep paralysis; overeating-related behaviors; and trouble with self-expression. He described his ADHD-related concerns as consistent and independent of mood, although environments with multiple stimuli, anxiety, and reduced sleep exacerbate them.   During the evaluation, Mr. Bobb was administered assessments to measure his current cognitive abilities. His verbal comprehension abilities were in the high average range, and he demonstrated the strongest performance on the Information subtest, which is primarily a measure of his fund of general knowledge but may also be influenced by cultural experience, quality of education, and ability to retrieve information from long-term memory. His lowest performances were on the Similarities subtest, which measured his ability to abstract meaningful concepts and relationships from verbally presented material as well as the Vocabulary subtest, which required him to explain the meaning of words presented in isolation, verbalize meaningful concepts, and retrieve information from long-term memory. This pattern of performance may suggest a relative weakness in language production and abstract reasoning. His ability to sustain attention, concentrate, and exert mental control was in the average range which suggests his ability to sustain attention, concentrate, and exert mental control are a relative weakness to his verbal reasoning abilities. Moreover, his score on the Arithmetic subtest is higher than his score on Digit Span which may indicate specific strengths in arithmetic computational skills rather than a general proficiency in working memory. Results of the CNS Vital Signs indicated an average neurocognitive processing ability. He demonstrated a weakness in  reaction time and strengths in working memory and sustained attention.    During the clinical interview and on self-report measures, [name] endorsed executive functioning impairment and attentional dysregulation, hyperactivity- and impulsivity-related symptoms, and meeting full criteria for ADHD. Moreover, his friend, Mr. Sharyl Nimrod indicated he is experiencing multiple significant executive functioning issues, although the results are potentially invalid. While invalid test results make interpretation difficult, when considering self-reported symptoms; endorsed and/or demonstrated weakness on measures of working memory and reaction time; his pediatrician reportedly having diagnosed him with ADHD and prescribed medication to assist in managing it; and a reported possible familial history of ADHD, a diagnosis of F90.2 Attention-Deficit/Hyperactivity Disorder, Combined Presentation, Moderate appears warranted. The specifier of "Combined Presentation" was assigned as he endorsed symptomatology at a level that met full criteria for the "Inattention" and "Hyperactivity and Impulsivity" criterions. The "Moderate" specifier was assigned as he endorsed symptoms in excess of what is needed to make the diagnosis and indicated it negatively impacts his academic (e.g., trouble sustaining his attention during class and completing homework which teachers commented on), social (e.g., difficulty sustaining his attention during conversations and remembering what was said as well as becoming emotionally distressed if interrupted from a task by someone), and daily (e.g., regularly experiencing problems initiating and completing tasks, fidgeting, and disorganization) functioning.   Mr. Defusco also endorsed a history of anxiety; periods of feeling "down" that usually last "a day or two," although he shared on one instance lasted for two-to-three months secondary to psychosocial stressors and a "panic attack;" generalized  anxiety and tension that can be seemingly random or "situational;" sleep maintenance, snoring that started in his "late teens" and appears related to periods of weight gain, and a "few" instances of sleep paralysis; overeating-related behaviors; and trouble with self-expression. As a result, the PHQ-9, GAD-7, and PAI were administered. His results indicated moderately severe depression- and moderate anxiety-related symptomatology. While it appears likely he meets criteria for a depressive and anxiety disorder, given the limited scope of this evaluation, it was  unable to be definitively determined if full criteria for these disorders are met or if they are better explained by his diagnosis of ADHD. Similarly, it was unable to be determined if he meets full criteria for a sleep-wake disorder, eating disorder, or language disorder. As such, he would likely benefit from further evaluation of these symptoms definitively rule in or out the aforementioned. Should any of them be ruled in, they do not appear to better explain his ADHD-related concerns as he indicated his ADHD-related concerns are consistent, occurring prior to sleep problems, and independent of mood.   DSM-5 Diagnostic Impressions: F90.2 Attention-Deficit/Hyperactivity Disorder, Combined Presentation, Moderate  RECOMMENDATIONS: Mr. Morefield would likely benefit from making use of strategies for ADHD symptoms:  Setting a timer to complete tasks. Breaking tasks into manageable chunks and spreading them out over longer periods of time with breaks.  Utilizing lists and day calendars to keep track of tasks.  Answering emails daily.  Improving listening skills by asking the speaker to give information in smaller chunks and asking for explanation for clarification as needed. Leaving more than the anticipated time to complete tasks. It may help to keep tasks brief, well within your attention span, and a mix of both high and low interest tasks. Tasks may  be gradually increased in length. Practice proactive planning by setting aside time every evening to plan for the next day (e.g., prepare needed materials or pack the car the night before).  Learn how to make an effective and reasonable "to do" list of important tasks and priorities and always keep it easily accessible. Make additional copies in case it is lost or misplaced. Utilize visual reminders by posting appointments, "to do lists," or schedule in strategic areas at home and at work.  Practice using an appointment book, smart phone or other tech device, or a daily planning calendar, and learn to write down appointments and commitments immediately. Keep notepads or use a portable audio recorder to capture important ideas that would be beneficial to recall later. Learn and practice time management skills. Purchase a programmable alarm watch or set an alarm on smartphone to avoid losing track of time.  Use a color-coded file system, desk and closet organizers, storage boxes, or other organization device to reduce clutter and improve efficiency and structure.  Implement ways to become more aware of your actions and to inhibit or adjust them as warranted (e.g., reviewing videos of your actions, consider consequences of obeying or not obeying the rules of various upcoming situations, have a trusted other to discuss plans with and/or provide cues to stop certain behaviors, and make visual cues for rules you would like to follow). Stay flexible and be prepared to change your plans as symptom breakthroughs and crises are likely to occur periodically. Mr. Kleckner may benefit from mindfulness training to address symptoms of inattention.  Mr. Noreen would likely benefit from a consultation regarding medication for ADHD symptoms.   Individual therapeutic services may assist in processing a diagnosis of ADHD and discussing coping and compensatory strategies. Mental alertness/energy can be raised by increasing  exercise; improving sleep; eating a healthy diet; and managing stress. It is recommended he consult with a physician regarding any changes to his physical regimen. "Failing at Normal: An ADHD Success Story" by Mellody Drown is a great overview of ADHD. Dr. Murlean Hark also has a YouTube channel with helpful videos on ADHD-related topics: [email or web address] Applications:  RescueTime. Tracks your activities on phone and/or computer to determine how productive you have  been, and what distracted you. Free two week trial.  Focus@Will . Uses engineered audio that human voice-like frequencies. Free 15-day trial. Freedom. Allows you to highlight days and times you want to block yourself from certain sites or apps. Free trial. Mint.  Allows you to input your bank accounts and creates a visual layout of various information about your financial goals, budget management, alerts, etc. Free. Boomerang. Gives you the option to schedule times an email is sent as well as to see if others have received or opened your email. 10 messages free per month and a free trial of premium version. IFTTT. Uses "channels" to create various actions (e.g., if you are mentioned in an email to highlight it in your inbox and if you miss a call to add it to a to-do list). Free and premium versions. Unroll.me. Cleans up your email by unsubscribing from what you do not want to receive while still getting everything you do. Free. Finish. Allows you to divide two-list tasks into short-term, mid-term, and long-term as well as how much time is left for a task. Focus mode hides non-priority tasks.  Autosilent. Turns your phone ringer on and off based on specified calendars, geo-fences, timers, etc. $3.99. Freakyalarm. Makes you solve math problems to disable an alarm. $1.99. Wake N Shake. Makes you vigorously shake your phone to stop the alarm. $.99. Todoist. Allows you to add sub-tasks to tasks as well  as includes email and Web plugins to make it work across system. Premium has location-based reminders, calendar sync, productive tracking, etc.  Sleep Cycle. Utilizes your phone's motion sensors to pick up on movement while you are asleep. The alarm will wake you as early as 30 minutes before your alarm based on your lightest phase of sleep as well as showing you how daily activities affect your sleep quality.  Book: "Taking Charge of Adult ADHD Second Edition" by Dr. Murlean Hark Organizations that are a good source of information on ADHD:  Children and Adults with Attention-Deficit/Hyperactivity Disorder (CHADD): chadd.org  Attention Deficit Disorder Association (ADDA): CondoFactory.com.cy ADD Resources: addresources.org ADD WareHouse: addwarehouse.com World Federation of ADHD: adhd-federation.org ADDConsults: https://www.hines.net/. Compilation of ADHD resources: EquipmentWeekly.com.ee Future evaluation if deemed necessary and/or to determine effectiveness of recommended interventions.   Bradley Turner, Psy.D. Licensed Psychologist - HSP-P [id number]           Dolores Lory, PsyD

## 2023-02-13 NOTE — Progress Notes (Unsigned)
     Established patient visit   Patient: Bradley Turner   DOB: June 30, 1991   31 y.o. Male  MRN: [id number] Visit Date: 02/14/2023  Today's healthcare provider: Charlton Amor, DO   No chief complaint on file.   SUBJECTIVE   No chief complaint on file.  HPI  Pt presents for concerns of ADHD  Review of Systems  Constitutional:  Negative for activity change, fatigue and fever.  Respiratory:  Negative for cough and shortness of breath.   Cardiovascular:  Negative for chest pain.  Gastrointestinal:  Negative for abdominal pain.  Genitourinary:  Negative for difficulty urinating.       No outpatient medications have been marked as taking for the 02/14/23 encounter (Appointment) with Charlton Amor, DO.    OBJECTIVE    There were no vitals taken for this visit.  Physical Exam Vitals and nursing note reviewed.  Constitutional:      General: He is not in acute distress.    Appearance: Normal appearance.  HENT:     Head: Normocephalic and atraumatic.     Right Ear: External ear normal.     Left Ear: External ear normal.     Nose: Nose normal.  Eyes:     Conjunctiva/sclera: Conjunctivae normal.  Cardiovascular:     Rate and Rhythm: Normal rate and regular rhythm.  Pulmonary:     Effort: Pulmonary effort is normal.     Breath sounds: Normal breath sounds.  Neurological:     General: No focal deficit present.     Mental Status: He is alert and oriented to person, place, and time.  Psychiatric:        Mood and Affect: Mood normal.        Behavior: Behavior normal.        Thought Content: Thought content normal.        Judgment: Judgment normal.      {Show previous labs (optional):23736}    ASSESSMENT & PLAN    Problem List Items Addressed This Visit   None   No follow-ups on file.      No orders of the defined types were placed in this encounter.   No orders of the defined types were placed in this encounter.    Charlton Amor, DO  [facility] Primary  Care & Sports Medicine at [facility] [phone] (phone) [phone] (fax)  [facility]

## 2023-02-14 ENCOUNTER — Encounter: Payer: Self-pay | Admitting: Family Medicine

## 2023-02-14 ENCOUNTER — Ambulatory Visit: Payer: BC Managed Care – PPO | Admitting: Family Medicine

## 2023-02-14 VITALS — BP 130/68 | HR 73 | Temp 98.8°F | Ht 70.0 in | Wt 192.1 lb

## 2023-02-14 DIAGNOSIS — F900 Attention-deficit hyperactivity disorder, predominantly inattentive type: Secondary | ICD-10-CM

## 2023-02-14 DIAGNOSIS — L700 Acne vulgaris: Secondary | ICD-10-CM | POA: Diagnosis not present

## 2023-02-14 DIAGNOSIS — F909 Attention-deficit hyperactivity disorder, unspecified type: Secondary | ICD-10-CM | POA: Diagnosis not present

## 2023-02-14 MED ORDER — METHYLPHENIDATE HCL ER (OSM) 18 MG PO TBCR: 18.0000 mg | EXTENDED_RELEASE_TABLET | Freq: Every day | ORAL | 0 refills | Status: DC

## 2023-02-14 MED ORDER — CLINDAMYCIN PHOS-BENZOYL PEROX 1-5 % EX GEL: Freq: Two times a day (BID) | CUTANEOUS | 1 refills | Status: DC

## 2023-02-14 NOTE — Assessment & Plan Note (Addendum)
-   pt had formal ADHD testing and was diagnosed with adhd; he will upload the actual testing documentation - since concerta worked as a child will go ahead and order this

## 2023-02-15 NOTE — Telephone Encounter (Signed)
Sent to scan

## 2023-02-17 ENCOUNTER — Telehealth: Payer: Self-pay

## 2023-02-17 NOTE — Telephone Encounter (Addendum)
Initiated Prior authorization for: Via: bcbsca Case/Key:[id number]  Status: approved  as of 02/17/23 Reason:Generic  is preferred, this approval is good until  11/06/38 Pt has bcbs ca fax only Notified Pt via: Mychart

## 2023-03-15 ENCOUNTER — Encounter: Payer: Self-pay | Admitting: Family Medicine

## 2023-03-15 ENCOUNTER — Ambulatory Visit: Payer: BC Managed Care – PPO | Admitting: Family Medicine

## 2023-03-15 VITALS — BP 121/77 | HR 86 | Ht 70.0 in | Wt 192.0 lb

## 2023-03-15 DIAGNOSIS — F900 Attention-deficit hyperactivity disorder, predominantly inattentive type: Secondary | ICD-10-CM

## 2023-03-15 MED ORDER — AMPHETAMINE-DEXTROAMPHET ER 5 MG PO CP24: 5.0000 mg | ORAL_CAPSULE | Freq: Every day | ORAL | 0 refills | Status: DC

## 2023-03-15 NOTE — Assessment & Plan Note (Signed)
-   pt did not tolerate concerta well--giving him anxiety, palpitations, and weird dreams  - since pt did not tolerate concerta, we will go ahead and try adderall xr 5mg   - pmp reviewed and verified with no red flags

## 2023-03-15 NOTE — Progress Notes (Signed)
Established patient visit   Patient: Bradley Turner   DOB: September 14, 1991   31 y.o. Male  MRN: [id number] Visit Date: 03/15/2023  Today's healthcare provider: Charlton Amor, DO   Chief Complaint  Patient presents with   ADHD follow up visit    SUBJECTIVE    Chief Complaint  Patient presents with   ADHD follow up visit   HPI  Pt presents for follow up on ADHD management. He was started on concerta 18mg . He notes the concerta has been making him feel weird. He has had weird dreams and has been anxious on concerta. He does not like the way it makes him feel.   Review of Systems  Constitutional:  Negative for activity change, fatigue and fever.  Respiratory:  Negative for cough and shortness of breath.   Cardiovascular:  Negative for chest pain.  Gastrointestinal:  Negative for abdominal pain.  Genitourinary:  Negative for difficulty urinating.       Current Meds  Medication Sig   amphetamine-dextroamphetamine (ADDERALL XR) 5 MG 24 hr capsule Take 1 capsule (5 mg total) by mouth daily.   clindamycin-benzoyl peroxide (BENZACLIN) gel Apply topically 2 (two) times daily.   [DISCONTINUED] methylphenidate (CONCERTA) 18 MG PO CR tablet Take 1 tablet (18 mg total) by mouth daily.   [DISCONTINUED] methylphenidate (CONCERTA) 18 MG PO CR tablet Take 1 tablet (18 mg total) by mouth daily.    OBJECTIVE    BP 121/77   Pulse 86   Ht 5\' 10"  (1.778 m)   Wt 192 lb (87.1 kg)   SpO2 99%   BMI 27.55 kg/m   Physical Exam Vitals and nursing note reviewed.  Constitutional:      General: He is not in acute distress.    Appearance: Normal appearance.  HENT:     Head: Normocephalic and atraumatic.     Right Ear: External ear normal.     Left Ear: External ear normal.     Nose: Nose normal.  Eyes:     Conjunctiva/sclera: Conjunctivae normal.  Cardiovascular:     Rate and Rhythm: Normal rate and regular rhythm.  Pulmonary:     Effort: Pulmonary effort is normal.     Breath sounds:  Normal breath sounds.  Neurological:     General: No focal deficit present.     Mental Status: He is alert and oriented to person, place, and time.  Psychiatric:        Mood and Affect: Mood normal.        Behavior: Behavior normal.        Thought Content: Thought content normal.        Judgment: Judgment normal.          ASSESSMENT & PLAN    Problem List Items Addressed This Visit       Other   Attention deficit hyperactivity disorder (ADHD), predominantly inattentive type - Primary    - pt did not tolerate concerta well--giving him anxiety, palpitations, and weird dreams  - since pt did not tolerate concerta, we will go ahead and try adderall xr 5mg   - pmp reviewed and verified with no red flags       Return in about 3 weeks (around 04/05/2023).      Meds ordered this encounter  Medications   amphetamine-dextroamphetamine (ADDERALL XR) 5 MG 24 hr capsule    Sig: Take 1 capsule (5 mg total) by mouth daily.    Dispense:  30 capsule  Refill:  0    No orders of the defined types were placed in this encounter.    Charlton Amor, DO  [facility] Primary Care & Sports Medicine at [facility] [phone] (phone) [phone] (fax)  [facility]

## 2023-04-05 ENCOUNTER — Encounter: Payer: Self-pay | Admitting: Family Medicine

## 2023-04-05 ENCOUNTER — Ambulatory Visit: Payer: BC Managed Care – PPO | Admitting: Family Medicine

## 2023-04-05 VITALS — BP 80/53 | HR 73 | Ht 70.0 in | Wt 192.0 lb

## 2023-04-05 DIAGNOSIS — F900 Attention-deficit hyperactivity disorder, predominantly inattentive type: Secondary | ICD-10-CM | POA: Diagnosis not present

## 2023-04-05 DIAGNOSIS — F411 Generalized anxiety disorder: Secondary | ICD-10-CM

## 2023-04-05 MED ORDER — AMPHETAMINE-DEXTROAMPHET ER 5 MG PO CP24: 5.0000 mg | ORAL_CAPSULE | Freq: Every day | ORAL | 0 refills | Status: DC

## 2023-04-05 NOTE — Assessment & Plan Note (Signed)
-   referral to therapy for CBT

## 2023-04-05 NOTE — Assessment & Plan Note (Signed)
-   continue adderall xr  - pmp checked and verified - discussed continued holidays on the weekends  - no tachycardia or weight loss with medication noted

## 2023-04-05 NOTE — Progress Notes (Signed)
Established patient visit   Patient: Bradley Turner   DOB: 04-12-1991   31 y.o. Male  MRN: [id number] Visit Date: 04/05/2023  Today's healthcare provider: Charlton Amor, DO   Chief Complaint  Patient presents with   Follow-up    SUBJECTIVE    Chief Complaint  Patient presents with   Follow-up   HPI  Pt presents for ADHD follow up. He did not tolerate concerta due to an increase in anxiety symptoms and was switched to adderall xr 5mg .   Review of Systems  Constitutional:  Negative for activity change, fatigue and fever.  Respiratory:  Negative for cough and shortness of breath.   Cardiovascular:  Negative for chest pain.  Gastrointestinal:  Negative for abdominal pain.  Genitourinary:  Negative for difficulty urinating.       Current Meds  Medication Sig   amphetamine-dextroamphetamine (ADDERALL XR) 5 MG 24 hr capsule Take 1 capsule (5 mg total) by mouth daily.   clindamycin-benzoyl peroxide (BENZACLIN) gel Apply topically 2 (two) times daily.    OBJECTIVE    BP (!) 80/53   Pulse 73   Ht 5\' 10"  (1.778 m)   Wt 192 lb (87.1 kg)   SpO2 99%   BMI 27.55 kg/m   Physical Exam Vitals and nursing note reviewed.  Constitutional:      General: He is not in acute distress.    Appearance: Normal appearance.  HENT:     Head: Normocephalic and atraumatic.     Right Ear: External ear normal.     Left Ear: External ear normal.     Nose: Nose normal.  Eyes:     Conjunctiva/sclera: Conjunctivae normal.  Cardiovascular:     Rate and Rhythm: Normal rate and regular rhythm.  Pulmonary:     Effort: Pulmonary effort is normal.     Breath sounds: Normal breath sounds.  Neurological:     General: No focal deficit present.     Mental Status: He is alert and oriented to person, place, and time.  Psychiatric:        Mood and Affect: Mood normal.        Behavior: Behavior normal.        Thought Content: Thought content normal.        Judgment: Judgment normal.           ASSESSMENT & PLAN    Problem List Items Addressed This Visit       Other   Attention deficit hyperactivity disorder (ADHD), predominantly inattentive type - Primary    - continue adderall xr  - pmp checked and verified - discussed continued holidays on the weekends  - no tachycardia or weight loss with medication noted       Relevant Medications   amphetamine-dextroamphetamine (ADDERALL XR) 5 MG 24 hr capsule   GAD (generalized anxiety disorder)    - referral to therapy for CBT      Relevant Orders   Ambulatory referral to Psychology    Return in about 3 months (around 07/06/2023).      Meds ordered this encounter  Medications   amphetamine-dextroamphetamine (ADDERALL XR) 5 MG 24 hr capsule    Sig: Take 1 capsule (5 mg total) by mouth daily.    Dispense:  30 capsule    Refill:  0    Orders Placed This Encounter  Procedures   Ambulatory referral to Psychology    Referral Priority:   Routine    Referral Type:  Psychiatric    Referral Reason:   Specialty Services Required    Requested Specialty:   Psychology    Number of Visits Requested:   1     Charlton Amor, DO  [facility] Primary Care & Sports Medicine at [facility] [phone] (phone) [phone] (fax)  [facility]

## 2023-05-03 ENCOUNTER — Other Ambulatory Visit: Payer: Self-pay | Admitting: Family Medicine

## 2023-05-03 DIAGNOSIS — F900 Attention-deficit hyperactivity disorder, predominantly inattentive type: Secondary | ICD-10-CM

## 2023-05-04 MED ORDER — AMPHETAMINE-DEXTROAMPHET ER 5 MG PO CP24: 5.0000 mg | ORAL_CAPSULE | Freq: Every day | ORAL | 0 refills | Status: DC

## 2023-05-04 NOTE — Telephone Encounter (Signed)
Requesting rx rf of Adderall XR 5mg  Last written 04/05/2023 Last OV 04/05/2023 Next upcoming appt 07/06/2023

## 2023-05-05 ENCOUNTER — Ambulatory Visit: Payer: BC Managed Care – PPO | Admitting: Behavioral Health

## 2023-07-05 NOTE — Progress Notes (Unsigned)
     Established patient visit   Patient: Bradley Turner   DOB: 1990/12/03   32 y.o. Male  MRN: [id number] Visit Date: 07/06/2023  Today's healthcare provider: Charlton Amor, DO   No chief complaint on file.   SUBJECTIVE   No chief complaint on file.  HPI   Pt presents for follow up on adderall 5mg  XR 24hr. Doing well, no concerns.   GAD- has been seeing psychology for CBT.   Review of Systems     No outpatient medications have been marked as taking for the 07/06/23 encounter (Appointment) with Charlton Amor, DO.    OBJECTIVE    There were no vitals taken for this visit.  Physical Exam     ASSESSMENT & PLAN    Problem List Items Addressed This Visit   None   No follow-ups on file.      No orders of the defined types were placed in this encounter.   No orders of the defined types were placed in this encounter.    Charlton Amor, DO  [facility] at [facility] [phone] (phone) [phone] (fax)  [facility]

## 2023-07-06 ENCOUNTER — Ambulatory Visit: Payer: BC Managed Care – PPO | Admitting: Family Medicine

## 2023-07-06 ENCOUNTER — Encounter: Payer: Self-pay | Admitting: Family Medicine

## 2023-07-06 VITALS — BP 127/75 | HR 82 | Ht 70.0 in | Wt 189.6 lb

## 2023-07-06 DIAGNOSIS — F411 Generalized anxiety disorder: Secondary | ICD-10-CM

## 2023-07-06 DIAGNOSIS — L739 Follicular disorder, unspecified: Secondary | ICD-10-CM | POA: Diagnosis not present

## 2023-07-06 DIAGNOSIS — L700 Acne vulgaris: Secondary | ICD-10-CM | POA: Diagnosis not present

## 2023-07-06 DIAGNOSIS — F900 Attention-deficit hyperactivity disorder, predominantly inattentive type: Secondary | ICD-10-CM

## 2023-07-06 MED ORDER — AMPHETAMINE-DEXTROAMPHETAMINE 10 MG PO TABS: 10.0000 mg | ORAL_TABLET | Freq: Two times a day (BID) | ORAL | 0 refills | Status: DC

## 2023-07-06 MED ORDER — CLINDAMYCIN PHOS-BENZOYL PEROX 1-5 % EX GEL
Freq: Two times a day (BID) | CUTANEOUS | 3 refills | Status: AC
Start: 2023-07-06 — End: ?

## 2023-07-06 NOTE — Assessment & Plan Note (Signed)
-   have increased adderall to 10mg  and have changed it to immediate release to see if this provides him better benefit

## 2023-07-06 NOTE — Assessment & Plan Note (Signed)
-   new referral sent to be requested with a male provider

## 2023-07-06 NOTE — Assessment & Plan Note (Signed)
-   have sent in refills of benzaclin gel as well as referral to dermatology

## 2023-11-15 ENCOUNTER — Encounter: Payer: Self-pay | Admitting: Family Medicine

## 2023-11-28 ENCOUNTER — Encounter: Payer: Self-pay | Admitting: Family Medicine

## 2023-11-28 ENCOUNTER — Telehealth: Payer: BC Managed Care – PPO | Admitting: Family Medicine

## 2023-11-28 DIAGNOSIS — F900 Attention-deficit hyperactivity disorder, predominantly inattentive type: Secondary | ICD-10-CM | POA: Diagnosis not present

## 2023-11-28 MED ORDER — AMPHETAMINE-DEXTROAMPHETAMINE 20 MG PO TABS
20.0000 mg | ORAL_TABLET | Freq: Two times a day (BID) | ORAL | 0 refills | Status: DC
Start: 2023-11-28 — End: 2024-03-21

## 2023-11-28 MED ORDER — AMPHETAMINE-DEXTROAMPHETAMINE 20 MG PO TABS
20.0000 mg | ORAL_TABLET | Freq: Two times a day (BID) | ORAL | 0 refills | Status: DC
Start: 2023-12-29 — End: 2024-03-21

## 2023-11-28 NOTE — Assessment & Plan Note (Signed)
Pt on video visit for concerns of adderall not being as effective. We will go ahead and increase adderall to 20mg  BID. Pt does prefer immediate release tablets so we will do that - follow up in one month

## 2023-11-28 NOTE — Progress Notes (Signed)
Established patient visit   Patient: Bradley Turner   DOB: 18-Aug-1991   32 y.o. Male  MRN: [id number] Visit Date: 11/28/2023  Today's healthcare provider: Charlton Amor, DO   Chief Complaint  Patient presents with   ADHD    Medication refill    SUBJECTIVE    Chief Complaint  Patient presents with   ADHD    Medication refill   HPI HPI     ADHD    Additional comments: Medication refill      Last edited by Roselyn Reef, CMA on 11/28/2023  8:18 AM.       I connected with  Bradley Turner on 11/28/23 by a video and audio enabled telemedicine application and verified that I am speaking with the correct person using two identifiers.  Patient Location: Home  Provider Location: Office/Clinic  I discussed the limitations of evaluation and management by telemedicine. The patient expressed understanding and agreed to proceed.  Pt presents to discuss ADHD medication. He feels as though the adderall has started to become ineffective over the last few months. He is currently on adderall 10mg  BID. He feels like he doesn't get as much of the "caffeinated feeling" from it and notices his focus has decreased.   Review of Systems  Constitutional:  Negative for activity change, fatigue and fever.  Respiratory:  Negative for cough and shortness of breath.   Cardiovascular:  Negative for chest pain.  Gastrointestinal:  Negative for abdominal pain.  Genitourinary:  Negative for difficulty urinating.       Current Meds  Medication Sig   amphetamine-dextroamphetamine (ADDERALL) 20 MG tablet Take 1 tablet (20 mg total) by mouth 2 (two) times daily.   [START ON 12/29/2023] amphetamine-dextroamphetamine (ADDERALL) 20 MG tablet Take 1 tablet (20 mg total) by mouth 2 (two) times daily.   clindamycin-benzoyl peroxide (BENZACLIN) gel Apply topically 2 (two) times daily.    OBJECTIVE      Physical Exam Vitals reviewed.  Constitutional:      Appearance: He is well-developed.  HENT:      Head: Normocephalic and atraumatic.  Eyes:     Conjunctiva/sclera: Conjunctivae normal.  Cardiovascular:     Rate and Rhythm: Normal rate.  Pulmonary:     Effort: Pulmonary effort is normal.  Skin:    General: Skin is dry.     Coloration: Skin is not pale.  Neurological:     Mental Status: He is alert and oriented to person, place, and time.  Psychiatric:        Behavior: Behavior normal.        ASSESSMENT & PLAN    Problem List Items Addressed This Visit       Other   Attention deficit hyperactivity disorder (ADHD), predominantly inattentive type - Primary   Pt on video visit for concerns of adderall not being as effective. We will go ahead and increase adderall to 20mg  BID. Pt does prefer immediate release tablets so we will do that - follow up in one month      Relevant Medications   amphetamine-dextroamphetamine (ADDERALL) 20 MG tablet   amphetamine-dextroamphetamine (ADDERALL) 20 MG tablet (Start on 12/29/2023)    Return in about 4 weeks (around 12/26/2023) for adhd follow up.      Meds ordered this encounter  Medications   amphetamine-dextroamphetamine (ADDERALL) 20 MG tablet    Sig: Take 1 tablet (20 mg total) by mouth 2 (two) times daily.    Dispense:  60 tablet  Refill:  0   amphetamine-dextroamphetamine (ADDERALL) 20 MG tablet    Sig: Take 1 tablet (20 mg total) by mouth 2 (two) times daily.    Dispense:  60 tablet    Refill:  0    No orders of the defined types were placed in this encounter.    Charlton Amor, DO  [facility] at [facility] [phone] (phone) [phone] (fax)  [facility]

## 2024-03-14 ENCOUNTER — Encounter: Payer: Self-pay | Admitting: Family Medicine

## 2024-03-20 ENCOUNTER — Ambulatory Visit: Payer: Self-pay

## 2024-03-20 NOTE — Telephone Encounter (Signed)
 FYI- Patient is scheduled to see the provider on 03/21/24.

## 2024-03-20 NOTE — Telephone Encounter (Signed)
 Copied from CRM [id number]. Topic: Clinical - Red Word Triage >> Mar 20, 2024  3:38 PM Blair Bumpers wrote: Red Word that prompted transfer to Nurse Triage: Patient is wanting a sooner appt. States he has been feeling kind of crappy lately. States it mostly happens at night. Patient states his voice is a little bit hoarse, headaches, weird body aches, lump in throat, & thinks he has thyroid issues since it does run in his family. Patient states he has been feeling this way since the third week in April.  Chief Complaint: insomnia Symptoms: can't sleep and can't stay asleep Frequency: happens more frequently no Pertinent Negatives: Patient denies cp, sob, fever Disposition: [] ED /[] Urgent Care (no appt availability in office) / [x] Appointment(In office/virtual)/ []  [facility]/ [] Home Care/ [] Refused Recommended Disposition /[] [facility]/ []  Follow-up with PCP Additional Notes: per protocol apt made; care advice given, denies questions; instructed to go to ER if becomes worse.   Reason for Disposition  Insomnia is an ongoing problem (> 2 weeks)  Answer Assessment - Initial Assessment Questions 1. DESCRIPTION: "Tell me about your sleeping problem."      Can't fall asleep, trouble staying asleep 2. ONSET: "How long have you been having trouble sleeping?" (e.g., days, weeks, months)     Been going on but is more frequent.  3. RECURRENT: "Have you had sleeping problems before?"  If Yes, ask: "What happened that time?" "What helped your sleeping problem go away in the past?"      yes 4. STRESS: "Is there anything in your life that is making you feel stressed or tense?"     Not really, but did start a new job 5. PAIN: "Do you have any pain that is keeping you awake?" (e.g., back pain, headache, abdomen pain)     Has slight headache now from lack of sleep 6. CAFFEINE ABUSE: "Do you drink caffeinated beverages, and how much each day?" (e.g., coffee, tea, colas)     One cup of  coffee in am and 1 cola in the evening 7. ALCOHOL USE OR SUBSTANCE USE (DRUG USE): "Do you drink alcohol or use any illegal drugs?"     no 8. OTHER SYMPTOMS: "Do you have any other symptoms?"  (e.g., difficulty breathing)     Body aches, lump in throat, denies sob but states light pressure.  Protocols used: Insomnia-A-AH

## 2024-03-21 ENCOUNTER — Ambulatory Visit: Payer: Self-pay | Admitting: Medical-Surgical

## 2024-03-21 VITALS — BP 121/75 | HR 74 | Resp 20 | Ht 70.0 in | Wt 181.1 lb

## 2024-03-21 DIAGNOSIS — R42 Dizziness and giddiness: Secondary | ICD-10-CM

## 2024-03-21 DIAGNOSIS — G4700 Insomnia, unspecified: Secondary | ICD-10-CM

## 2024-03-21 MED ORDER — TRAZODONE HCL 50 MG PO TABS
25.0000 mg | ORAL_TABLET | Freq: Every evening | ORAL | 3 refills | Status: AC | PRN
Start: 2024-03-21 — End: ?

## 2024-03-21 NOTE — Progress Notes (Signed)
 Established patient visit  History, exam, impression, and plan:  1. Insomnia, unspecified type (Primary) Pleasant 33 year old male presenting today with reports of difficulty sleeping.  He has symptoms as noted below that makes it hard to fall asleep but also has difficulty with delayed sleep onset.  His mom had an old prescription of Ambien 5 mg that she got after having surgery.  He has been using those sparingly and reports that they do work very well to "knock him out".  He has tried using ZzzQuil which was also helpful.  He does not take these nightly and usually will only take the medication if he is unable to get to sleep for 2 to 3 days.  Not taking any over-the-counter supplements or vitamins for sleep.  Is interested in something to help at this point so that he is not using medications intended for other people.  Discussed various options for treatment.  Plan to work on sleep hygiene.  Starting trazodone 25-50 mg nightly as needed for sleep.  May want to take this scheduled for at least a couple of weeks before switching over to as needed use.  Patient verbalized understanding is agreeable to the plan. - traZODone (DESYREL) 50 MG tablet; Take 0.5-1 tablets (25-50 mg total) by mouth at bedtime as needed for sleep.  Dispense: 30 tablet; Refill: 3  2. Dizziness Presents today with 3-4 weeks of unusual symptoms.  He is having what he turns as a head rush along with upper chest pressure.  These episodes last for 2 to 3 seconds and occur most often when he is lying down at night.  He has had a couple of episodes while sitting at his desk in the chair.  Over the last 3 to 4 weeks, these episodes have increased in frequency and now it is occurring every day/night.  Notes that this sensation is the feeling of blood rushing to his head such as it does when you have been frightened.  Thinks that he may have some dizziness with these episodes but it is very quick and mild.  No tinnitus, headache,  hearing change, vision change, palpitations, chest pain, shortness of breath, diaphoresis, unilateral weakness, speech difficulty, syncope, or mental status changes.  No recent changes to medications, daily activities, or foods.  He does note getting a new job at the end of March.  Admits that this job is less stressful as there is less quantity of work to do and he has made the transition very smoothly.  Feels that his anxiety levels are well-controlled and does not think that anxiety plays a role in his symptoms.  Discussed the vague nature of the symptoms and the wide differential list.  Today, his exam is normal with normal neurological function, normal mental status, and normal cardiopulmonary exam.  Plan to start with basic labs since we do not have any on file for at least 10 years.  He does have a family history of thyroid disease and has noted that he feels like there is a lump in his throat at times.  Plan to check TSH and free T4.  Adding iron and magnesium.  Unfortunately, he is uninsured which places a limit on our ability to do a full workup and evaluation.  On exam he does have some thyromegaly on the left side.  Ultimately would like to get a thyroid ultrasound for further evaluation but we will see what his labs look like before making further plans. -  CBC with Differential/Platelet - CMP14+EGFR - TSH - T4, free - Iron, TIBC and Ferritin Panel - Magnesium  Procedures performed this visit: None.  Return if symptoms worsen or fail to improve.  __________________________________ Maryl Snook, DNP, APRN, FNP-BC Primary Care and Sports Medicine [facility] [place]

## 2024-03-22 LAB — CMP14+EGFR
ALT: 25 IU/L (ref 0–44)
AST: 18 IU/L (ref 0–40)
Albumin: 5 g/dL (ref 4.1–5.1)
Alkaline Phosphatase: 84 IU/L (ref 44–121)
BUN/Creatinine Ratio: 14 (ref 9–20)
BUN: 14 mg/dL (ref 6–20)
Bilirubin Total: 0.6 mg/dL (ref 0.0–1.2)
CO2: 21 mmol/L (ref 20–29)
Calcium: 9.6 mg/dL (ref 8.7–10.2)
Chloride: 101 mmol/L (ref 96–106)
Creatinine, Ser: 0.97 mg/dL (ref 0.76–1.27)
Globulin, Total: 2.6 g/dL (ref 1.5–4.5)
Glucose: 86 mg/dL (ref 70–99)
Potassium: 4.4 mmol/L (ref 3.5–5.2)
Sodium: 140 mmol/L (ref 134–144)
Total Protein: 7.6 g/dL (ref 6.0–8.5)
eGFR: 107 mL/min/{1.73_m2} (ref >59)

## 2024-03-22 LAB — CBC WITH DIFFERENTIAL/PLATELET
Basophils Absolute: 0 10*3/uL (ref 0.0–0.2)
Basos: 1 %
EOS (ABSOLUTE): 0.1 10*3/uL (ref 0.0–0.4)
Eos: 1 %
Hematocrit: 51.9 % — ABNORMAL HIGH (ref 37.5–51.0)
Hemoglobin: 17.1 g/dL (ref 13.0–17.7)
Immature Grans (Abs): 0 10*3/uL (ref 0.0–0.1)
Immature Granulocytes: 0 %
Lymphocytes Absolute: 1.6 10*3/uL (ref 0.7–3.1)
Lymphs: 33 %
MCH: 30.2 pg (ref 26.6–33.0)
MCHC: 32.9 g/dL (ref 31.5–35.7)
MCV: 92 fL (ref 79–97)
Monocytes Absolute: 0.6 10*3/uL (ref 0.1–0.9)
Monocytes: 12 %
Neutrophils Absolute: 2.6 10*3/uL (ref 1.4–7.0)
Neutrophils: 53 %
Platelets: 239 10*3/uL (ref 150–450)
RBC: 5.66 x10E6/uL (ref 4.14–5.80)
RDW: 12.6 % (ref 11.6–15.4)
WBC: 4.9 10*3/uL (ref 3.4–10.8)

## 2024-03-22 LAB — T4, FREE: Free T4: 1.41 ng/dL (ref 0.82–1.77)

## 2024-03-22 LAB — IRON,TIBC AND FERRITIN PANEL
Ferritin: 307 ng/mL (ref 30–400)
Iron Saturation: 34 % (ref 15–55)
Iron: 119 ug/dL (ref 38–169)
Total Iron Binding Capacity: 345 ug/dL (ref 250–450)
UIBC: 226 ug/dL (ref 111–343)

## 2024-03-22 LAB — SPECIMEN STATUS REPORT

## 2024-03-22 LAB — TSH: TSH: 2.01 u[IU]/mL (ref 0.450–4.500)

## 2024-03-22 LAB — MAGNESIUM: Magnesium: 2.2 mg/dL (ref 1.6–2.3)

## 2024-03-23 ENCOUNTER — Ambulatory Visit: Payer: Self-pay | Admitting: Medical-Surgical

## 2024-03-23 DIAGNOSIS — E041 Nontoxic single thyroid nodule: Secondary | ICD-10-CM

## 2024-03-25 NOTE — Telephone Encounter (Signed)
Thyroid ultrasound ordered.

## 2024-04-03 ENCOUNTER — Ambulatory Visit

## 2024-04-03 DIAGNOSIS — E041 Nontoxic single thyroid nodule: Secondary | ICD-10-CM

## 2024-04-03 DIAGNOSIS — R590 Localized enlarged lymph nodes: Secondary | ICD-10-CM | POA: Diagnosis not present

## 2024-04-04 ENCOUNTER — Ambulatory Visit: Payer: Self-pay | Admitting: Medical-Surgical

## 2024-05-07 ENCOUNTER — Encounter: Payer: Self-pay | Admitting: Urgent Care

## 2024-05-07 ENCOUNTER — Encounter: Payer: Self-pay | Admitting: Family Medicine

## 2024-05-21 ENCOUNTER — Encounter: Payer: Self-pay | Admitting: Family Medicine
